# Patient Record
Sex: Male | Born: 1970 | Race: White | Hispanic: No | Marital: Single
Health system: Southern US, Community
[De-identification: ages and names within clinical notes are randomized; demographics above are authoritative.]

---

## 2009-04-02 ENCOUNTER — Ambulatory Visit: Payer: Self-pay | Admitting: Surgery

## 2009-04-11 ENCOUNTER — Ambulatory Visit: Payer: Self-pay | Admitting: Surgery

## 2011-05-27 ENCOUNTER — Ambulatory Visit: Payer: Self-pay | Admitting: Nephrology

## 2013-03-23 ENCOUNTER — Ambulatory Visit: Payer: Self-pay

## 2014-06-12 IMAGING — CR DG KNEE COMPLETE 4+V*L*
1 series · 4 of 4 positions shown · non-contrast
Comparison: none

REASON FOR EXAM: pain
COMMENTS:

PROCEDURE:     MDR - MDR KNEE LT COMPLETE W/OBLIQUES  - March 23, 2013  [DATE]
RESULT:     Comparison: None.

[Series 1: ap · 0.17mm/px · 4 of 4 slices shown]
[im 1/4]
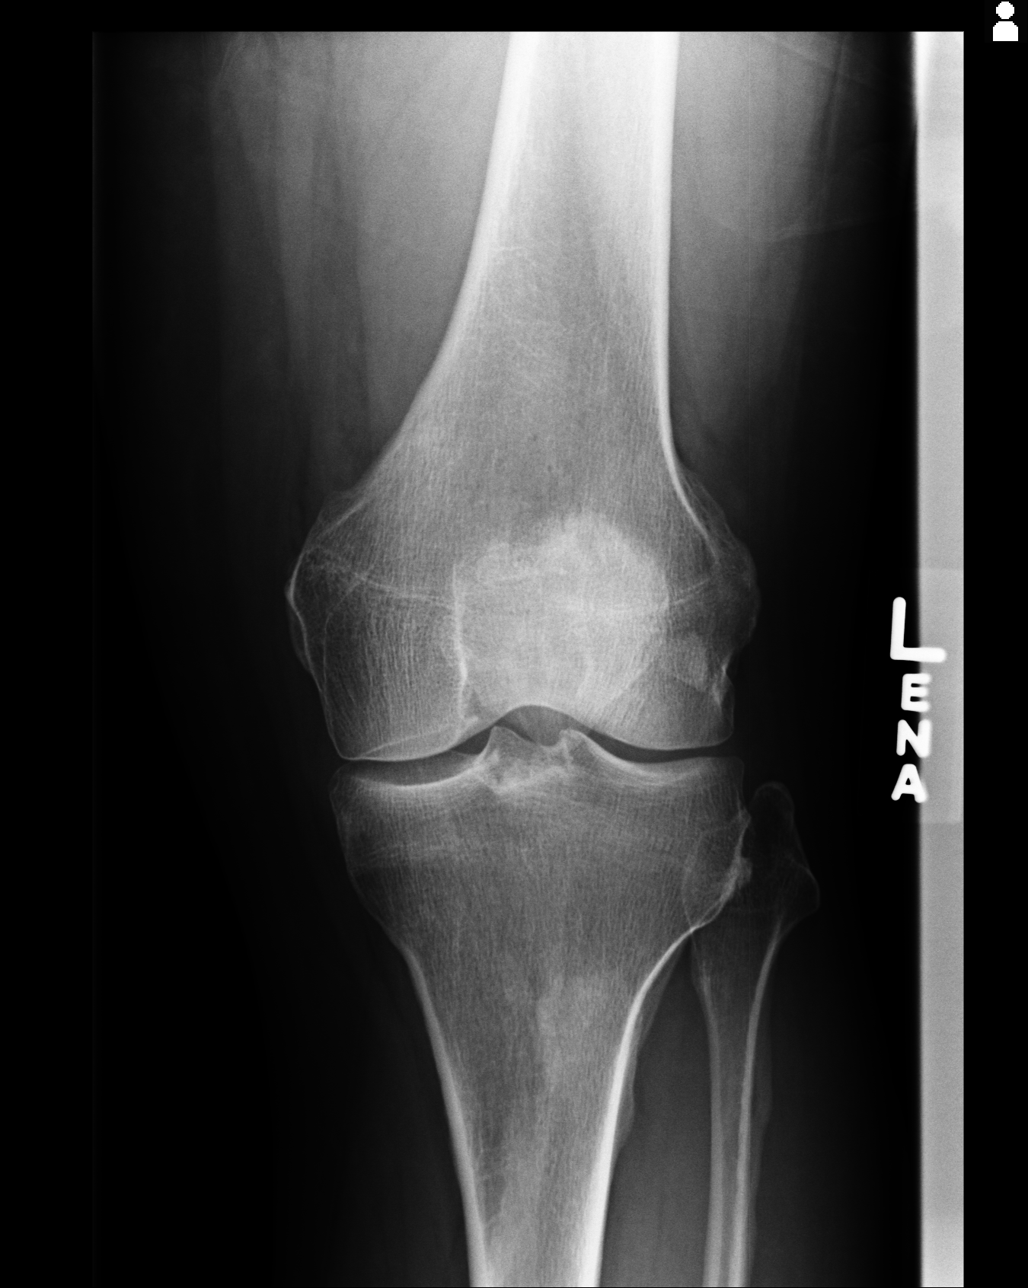
[im 2/4]
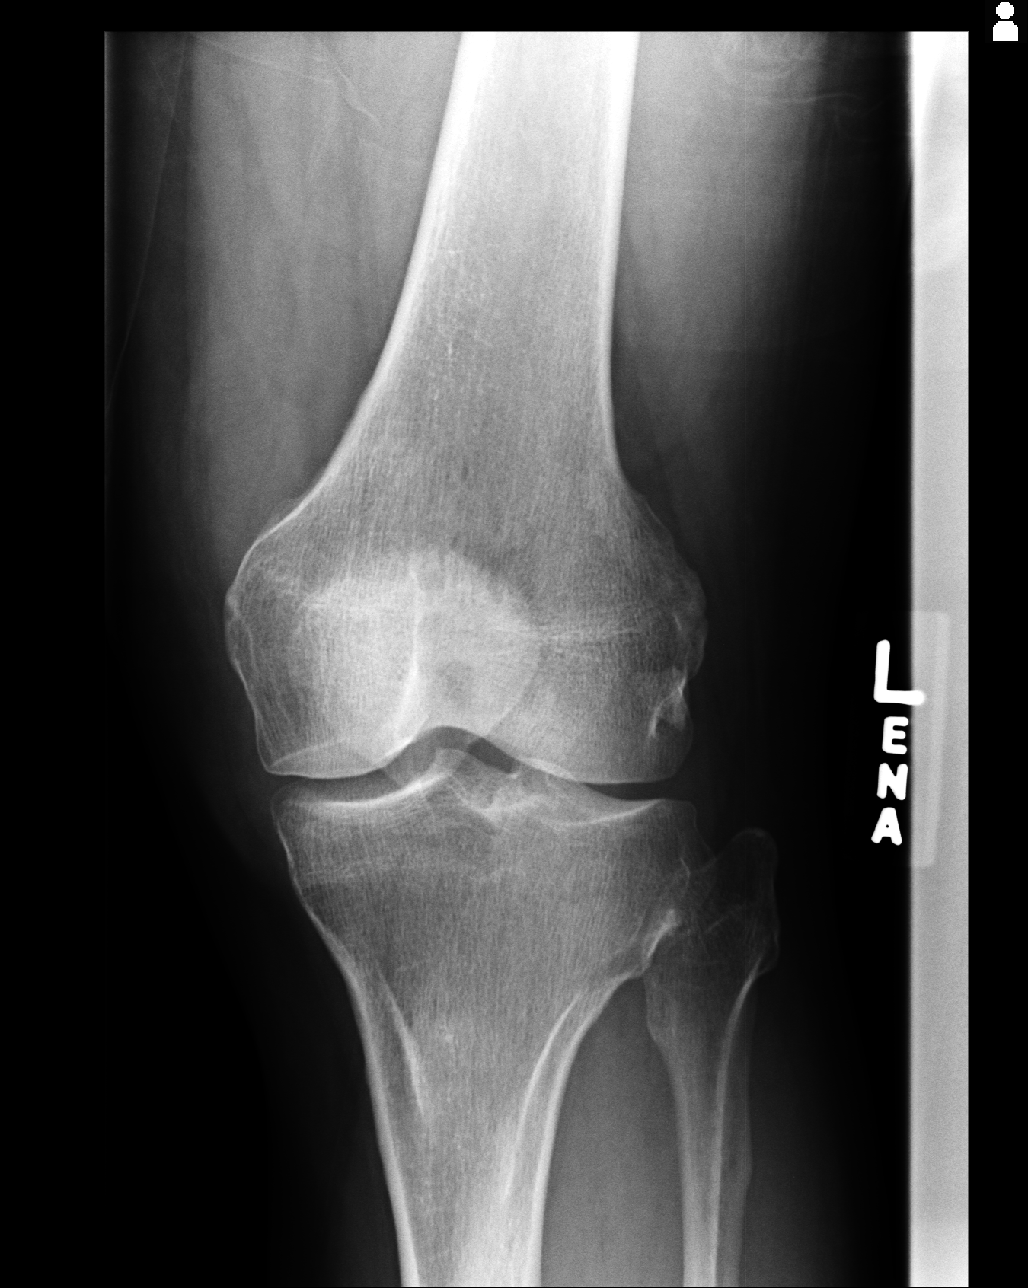
[im 3/4]
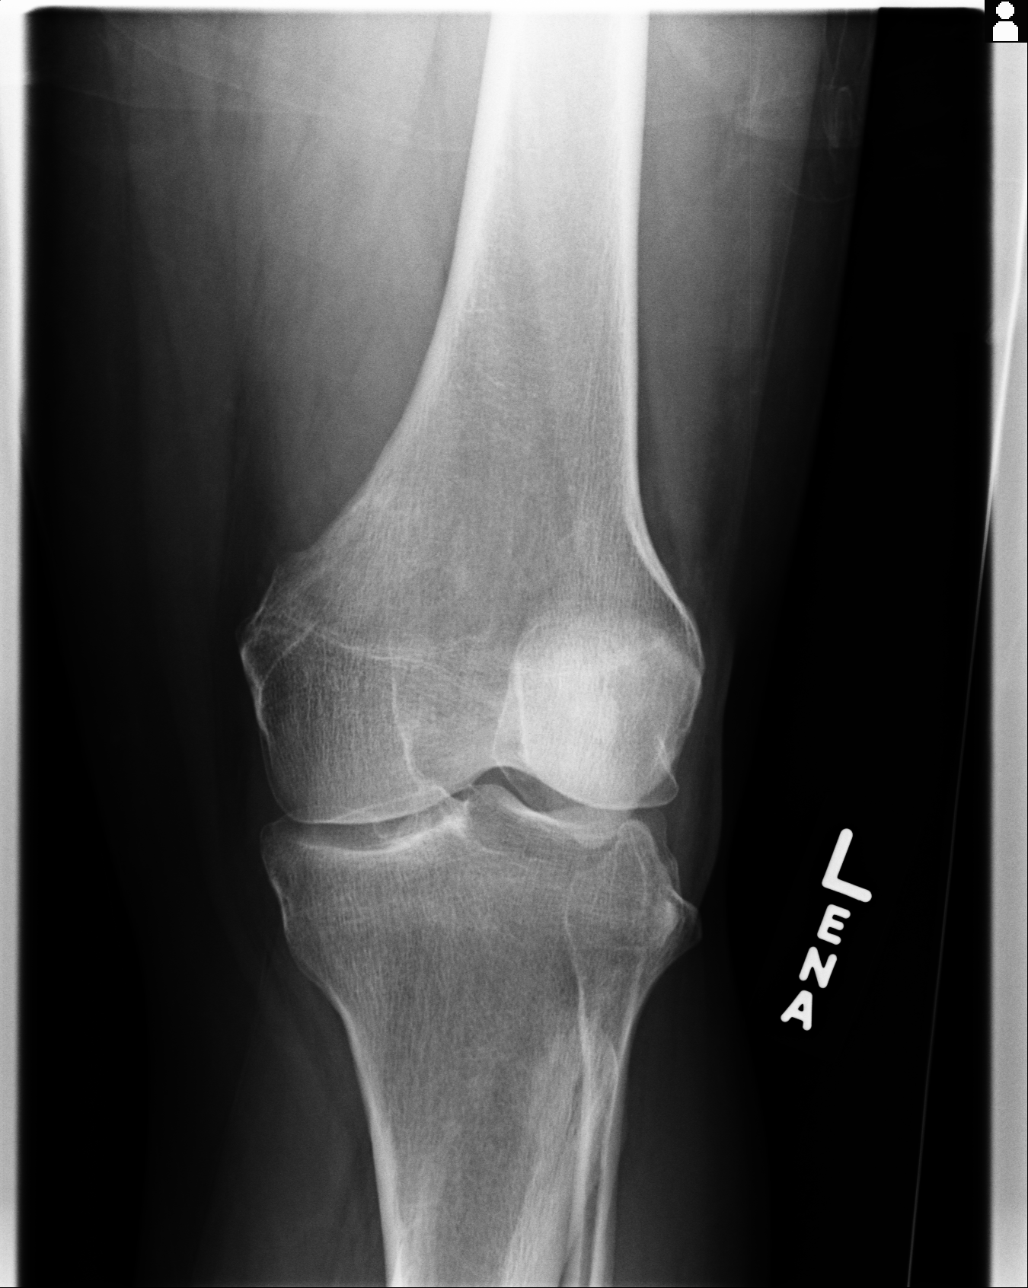
[im 4/4]
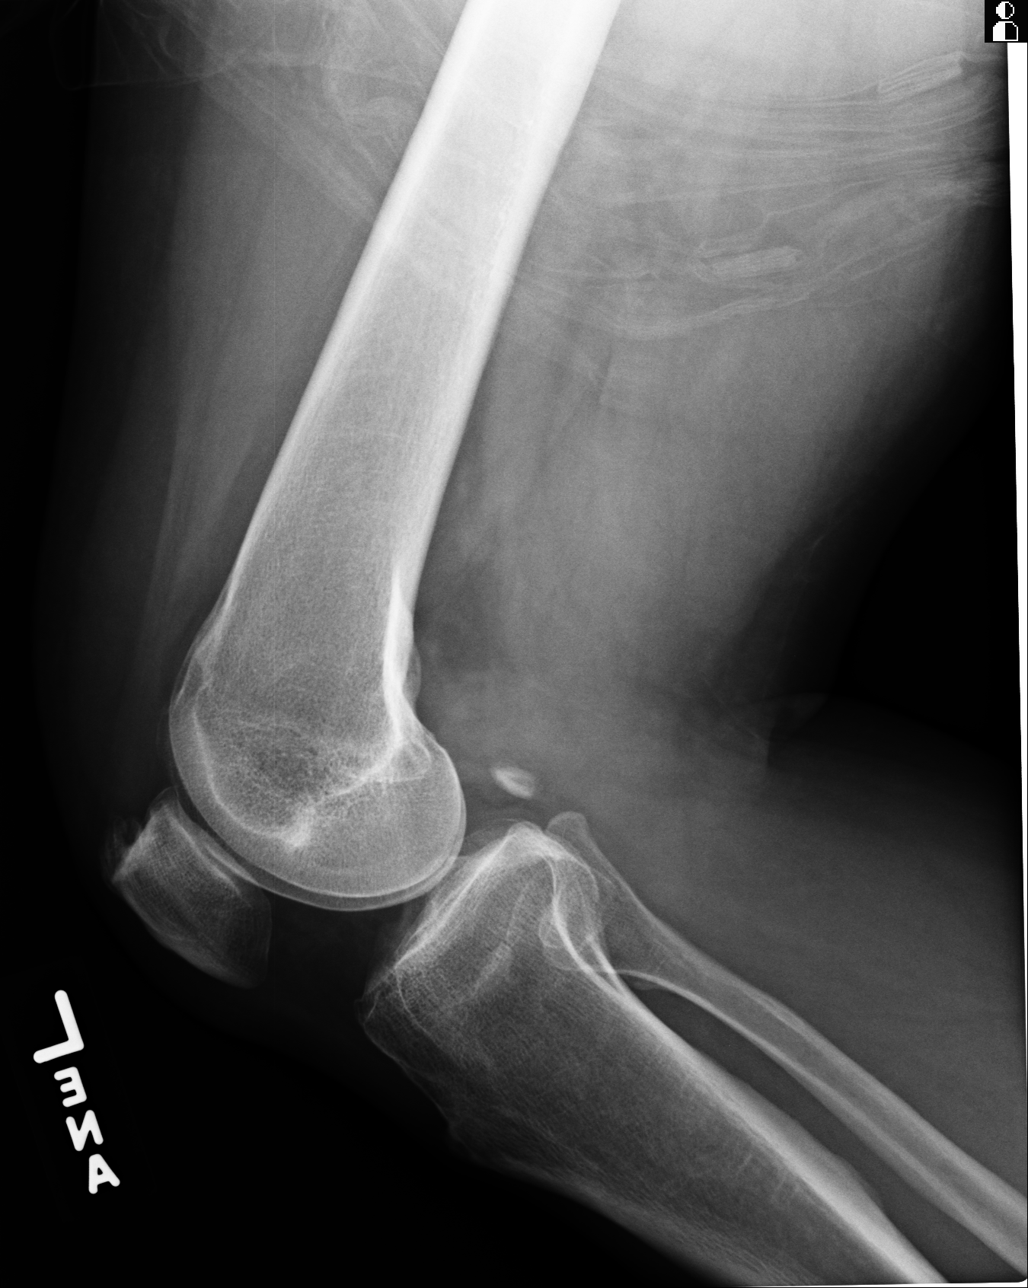

[4 of 4 positions shown; findings below may reference images not displayed]

FINDINGS: No acute fracture. No significant effusion. The joint spaces are relatively
preserved.
IMPRESSION: No acute findings.

[REDACTED]

## 2020-01-31 ENCOUNTER — Other Ambulatory Visit: Payer: Self-pay

## 2020-01-31 MED ORDER — COSENTYX SENSOREADY (300 MG) 150 MG/ML ~~LOC~~ SOAJ: 300.0000 mg | SUBCUTANEOUS | 3 refills | Status: DC

## 2020-01-31 NOTE — Telephone Encounter (Signed)
RF request e-scripted in per [facility] request.

## 2020-04-01 ENCOUNTER — Other Ambulatory Visit: Payer: Self-pay | Admitting: Dermatology

## 2020-04-09 ENCOUNTER — Ambulatory Visit (INDEPENDENT_AMBULATORY_CARE_PROVIDER_SITE_OTHER): Payer: PRIVATE HEALTH INSURANCE | Admitting: Dermatology

## 2020-04-09 ENCOUNTER — Other Ambulatory Visit: Payer: Self-pay

## 2020-04-09 DIAGNOSIS — L304 Erythema intertrigo: Secondary | ICD-10-CM

## 2020-04-09 DIAGNOSIS — L409 Psoriasis, unspecified: Secondary | ICD-10-CM | POA: Diagnosis not present

## 2020-04-09 NOTE — Progress Notes (Signed)
   Follow-Up Visit   Subjective  Bryan Chandler. is a 49 y.o. male who presents for the following: Psoriasis (3 months f/u Psoriasis, pt taking Cosentyx injection once a month with a good response, using Enstilar foam prn ).  The following portions of the chart were reviewed this encounter and updated as appropriate:  Allergies  Meds  Problems  Med Hx  Surg Hx  Fam Hx      Review of Systems:  No other skin or systemic complaints except as noted in HPI or Assessment and Plan.  Objective  Well appearing patient in no apparent distress; mood and affect are within normal limits.  A focused examination was performed including face,abdomen,back,legs,groin . Relevant physical exam findings are noted in the Assessment and Plan.  Objective  Right Abdomen (side) - Lower, Right pretibial, waistline, low back, groin, abdomen: Well-demarcated erythematous papules/plaques with silvery scale, guttate pink scaly papules. Large plaque across the lower abdomen   Objective  lower abdomen: Large erythematous silvery plaque   Assessment & Plan    Psoriasis - severe - on systemic "biologic" Consentyx with significant improvement, but not clear.(2) Right pretibial, waistline, low back, groin, abdomen; Right Abdomen (side) - Lower  BSA 6% Cont Consentyx injection once a month Cont Enstilar foam apply to skin qd-bid avoid f/g/a  Labs ordered CBC with diff, CMP, Quantiferon TB gold   Other Related Procedures CMP QuantiFERON-TB Gold Plus CBC with Differential/Platelets  Intertrigo lower abdomen  Psoriasis with intertrigo   Start Skin medicinal compound cream Intertrigo (Iodoquinole/HTC) Iodoquinole/HTC apply to skin bid   [email or web address]    Return in about 6 months (around 10/09/2020) for Psoriasis .   IAngelique Holm, CMA, am acting as scribe for Armida Sans, MD .  Documentation: I have reviewed the above documentation for accuracy and completeness, and I agree  with the above.  Armida Sans, MD

## 2020-04-09 NOTE — Patient Instructions (Signed)
Instructions for [facility] Medications  One or more of your medications was sent to the [facility] mail order compounding pharmacy. You will receive an email from them and can purchase the medicine through that link. It will then be mailed to your home at the address you confirmed. If for any reason you do not receive an email from them, please check your spam folder. If you still do not find the email, please let us know.    

## 2020-04-13 ENCOUNTER — Encounter: Payer: Self-pay | Admitting: Dermatology

## 2020-04-15 ENCOUNTER — Encounter: Payer: Self-pay | Admitting: Dermatology

## 2020-04-20 LAB — CBC WITH DIFFERENTIAL/PLATELET
Basophils Absolute: 0 10*3/uL (ref 0.0–0.2)
Basos: 1 %
EOS (ABSOLUTE): 0.2 10*3/uL (ref 0.0–0.4)
Eos: 2 %
Hematocrit: 47 % (ref 37.5–51.0)
Hemoglobin: 16.1 g/dL (ref 13.0–17.7)
Immature Grans (Abs): 0 10*3/uL (ref 0.0–0.1)
Immature Granulocytes: 0 %
Lymphocytes Absolute: 1.9 10*3/uL (ref 0.7–3.1)
Lymphs: 26 %
MCH: 29.2 pg (ref 26.6–33.0)
MCHC: 34.3 g/dL (ref 31.5–35.7)
MCV: 85 fL (ref 79–97)
Monocytes Absolute: 0.5 10*3/uL (ref 0.1–0.9)
Monocytes: 7 %
Neutrophils Absolute: 4.9 10*3/uL (ref 1.4–7.0)
Neutrophils: 64 %
Platelets: 335 10*3/uL (ref 150–450)
RBC: 5.51 x10E6/uL (ref 4.14–5.80)
RDW: 12.5 % (ref 11.6–15.4)
WBC: 7.5 10*3/uL (ref 3.4–10.8)

## 2020-04-20 LAB — QUANTIFERON-TB GOLD PLUS
QuantiFERON Mitogen Value: >10 IU/mL
QuantiFERON Nil Value: 0.04 IU/mL
QuantiFERON TB1 Ag Value: 0.03 IU/mL
QuantiFERON TB2 Ag Value: 0.04 IU/mL
QuantiFERON-TB Gold Plus: NEGATIVE

## 2020-04-20 LAB — COMPREHENSIVE METABOLIC PANEL
ALT: 28 IU/L (ref 0–44)
AST: 25 IU/L (ref 0–40)
Albumin/Globulin Ratio: 1.4 (ref 1.2–2.2)
Albumin: 4.6 g/dL (ref 4.0–5.0)
Alkaline Phosphatase: 115 IU/L (ref 48–121)
BUN/Creatinine Ratio: 12 (ref 9–20)
BUN: 8 mg/dL (ref 6–24)
Bilirubin Total: 0.6 mg/dL (ref 0.0–1.2)
CO2: 23 mmol/L (ref 20–29)
Calcium: 10 mg/dL (ref 8.7–10.2)
Chloride: 98 mmol/L (ref 96–106)
Creatinine, Ser: 0.69 mg/dL — ABNORMAL LOW (ref 0.76–1.27)
GFR calc Af Amer: 130 mL/min/{1.73_m2} (ref >59)
GFR calc non Af Amer: 112 mL/min/{1.73_m2} (ref >59)
Globulin, Total: 3.3 g/dL (ref 1.5–4.5)
Glucose: 94 mg/dL (ref 65–99)
Potassium: 4.3 mmol/L (ref 3.5–5.2)
Sodium: 135 mmol/L (ref 134–144)
Total Protein: 7.9 g/dL (ref 6.0–8.5)

## 2020-04-21 ENCOUNTER — Telehealth: Payer: Self-pay

## 2020-04-21 NOTE — Telephone Encounter (Signed)
-----   Message from David C Kowalski, MD sent at 04/21/2020 12:45 PM EDT ----- Blood counts; Chemistries all OK. TB test negative = normal  Continue Cosentyx Keep fu appts 

## 2020-04-21 NOTE — Telephone Encounter (Signed)
Left message on voicemail to return my call.  

## 2020-04-28 ENCOUNTER — Telehealth: Payer: Self-pay

## 2020-04-28 NOTE — Telephone Encounter (Signed)
Left message on voicemail to return my call.  

## 2020-04-28 NOTE — Telephone Encounter (Signed)
-----   Message from Deirdre Evener, MD sent at 04/21/2020 12:45 PM EDT ----- Blood counts; Chemistries all OK. TB test negative = normal  Continue Cosentyx Keep fu appts

## 2020-04-28 NOTE — Telephone Encounter (Signed)
Patient advised labs OK and to continue Cosentyx.

## 2020-06-02 ENCOUNTER — Other Ambulatory Visit: Payer: Self-pay

## 2020-06-02 MED ORDER — COSENTYX SENSOREADY (300 MG) 150 MG/ML ~~LOC~~ SOAJ: 300.0000 mg | SUBCUTANEOUS | 5 refills | Status: DC

## 2020-06-02 NOTE — Progress Notes (Signed)
RF request approved.

## 2020-06-04 ENCOUNTER — Other Ambulatory Visit: Payer: Self-pay

## 2020-06-04 MED ORDER — COSENTYX SENSOREADY (300 MG) 150 MG/ML ~~LOC~~ SOAJ: 300.0000 mg | SUBCUTANEOUS | 5 refills | Status: DC

## 2020-06-04 NOTE — Progress Notes (Signed)
Cosentyx sent to [facility] to correct the quantity.

## 2020-06-18 ENCOUNTER — Ambulatory Visit (INDEPENDENT_AMBULATORY_CARE_PROVIDER_SITE_OTHER): Payer: PRIVATE HEALTH INSURANCE | Admitting: Dermatology

## 2020-06-18 ENCOUNTER — Other Ambulatory Visit: Payer: Self-pay

## 2020-06-18 DIAGNOSIS — L409 Psoriasis, unspecified: Secondary | ICD-10-CM

## 2020-06-18 MED ORDER — MOMETASONE FUROATE 0.1 % EX CREA: 1.0000 "application " | TOPICAL_CREAM | Freq: Every day | CUTANEOUS | 3 refills | Status: DC | PRN

## 2020-06-18 NOTE — Progress Notes (Signed)
   Follow-Up Visit   Subjective  Bryan Chandler. is a 49 y.o. male who presents for the following: Psoriasis (2 months f/u Psoriasis flare, pt taking Consenytx injection once a month). Pt is not using any topical creams.  The following portions of the chart were reviewed this encounter and updated as appropriate:  Allergies  Meds  Problems  Med Hx  Surg Hx  Fam Hx     Review of Systems:  No other skin or systemic complaints except as noted in HPI or Assessment and Plan.  Objective  Well appearing patient in no apparent distress; mood and affect are within normal limits.  A focused examination was performed including R ankle, waistline . Relevant physical exam findings are noted in the Assessment and Plan.  Objective  R ankle, waistline: 8.5 cm plaque at R ankle, 2 plaques at waistline    Assessment & Plan  Psoriasis with skin flare R ankle, waistline Psoriasis - severe - on systemic "biologic" Consentyx with significant improvement, but not clear.  Discussed with pt he will get occasionally flares, no cure for Psoriasis can only control.   Start Mometasone cream apply to skin qd-bid prn  Cont Cosentyx injection once every month   Ordered Medications: mometasone (ELOCON) 0.1 % cream  Return for as scheduled in December .  IAngelique Holm, CMA, am acting as scribe for Bryan Sans, MD .  Documentation: I have reviewed the above documentation for accuracy and completeness, and I agree with the above.  Bryan Sans, MD

## 2020-06-25 ENCOUNTER — Encounter: Payer: Self-pay | Admitting: Dermatology

## 2020-10-08 ENCOUNTER — Ambulatory Visit (INDEPENDENT_AMBULATORY_CARE_PROVIDER_SITE_OTHER): Payer: PRIVATE HEALTH INSURANCE | Admitting: Dermatology

## 2020-10-08 ENCOUNTER — Other Ambulatory Visit: Payer: Self-pay

## 2020-10-08 DIAGNOSIS — L409 Psoriasis, unspecified: Secondary | ICD-10-CM | POA: Diagnosis not present

## 2020-10-08 NOTE — Progress Notes (Signed)
   Follow-Up Visit   Subjective  Bryan Chandler. is a 49 y.o. male who presents for the following: Psoriasis (6 months f/u Psoriasis treating with Cosentyx injection every 4 weeks injections by his mom and using Betamethasone cream on the feet, arms ).  The following portions of the chart were reviewed this encounter and updated as appropriate:   Allergies  Meds  Problems  Med Hx  Surg Hx  Fam Hx     Review of Systems:  No other skin or systemic complaints except as noted in HPI or Assessment and Plan.  Objective  Well appearing patient in no apparent distress; mood and affect are within normal limits.  A focused examination was performed including face,scalp,arms.legs,ankles,chest,back . Relevant physical exam findings are noted in the Assessment and Plan.  Objective  pretibial, ankle, back, L elbow, L arm, scalp:  Well-demarcated erythematous papules/plaques with silvery scale, guttate pink scaly papules.    Assessment & Plan  Psoriasis pretibial, ankle, back, L elbow, L arm, scalp  Psoriasis much improved but not clear.  Better controlled, but not to goal.  Psoriasis - severe on systemic "biologic" treatment injections.  Psoriasis is a chronic non-curable, but treatable genetic/hereditary disease that may have other systemic features affecting other organ systems such as joints (Psoriatic Arthritis).  It is linked with heart disease, inflammatory bowel disease, non-alcoholic fatty liver disease, and depression. Significant skin psoriasis and/or psoriatic arthritis may have significant symptoms and affects activities of daily activity and often benefits from systemic "biologic" injection treatments.  These "biologic" treatments have some potential side effects including immunosuppression and require pre-treatment laboratory screening and periodic laboratory monitoring and periodic in person evaluation and monitoring by the attending dermatologist physician.   BSA on Cosentyx  5%   Cont Betamethasone cream qd-bid Cont Cosenyx injection every 4 weeks   We will order labs at the next office visit   Other Related Medications mometasone (ELOCON) 0.1 % cream  Return in about 6 months (around 04/08/2021).  IAngelique Holm, CMA, am acting as scribe for Armida Sans, MD .  Documentation: I have reviewed the above documentation for accuracy and completeness, and I agree with the above.  Armida Sans, MD

## 2020-10-13 ENCOUNTER — Encounter: Payer: Self-pay | Admitting: Dermatology

## 2020-11-11 ENCOUNTER — Other Ambulatory Visit: Payer: Self-pay

## 2020-11-11 DIAGNOSIS — L409 Psoriasis, unspecified: Secondary | ICD-10-CM

## 2020-11-11 MED ORDER — COSENTYX SENSOREADY (300 MG) 150 MG/ML ~~LOC~~ SOAJ: 300.0000 mg | SUBCUTANEOUS | 5 refills | Status: DC

## 2021-03-10 ENCOUNTER — Other Ambulatory Visit: Payer: Self-pay | Admitting: Dermatology

## 2021-04-01 ENCOUNTER — Ambulatory Visit: Payer: 59 | Admitting: Dermatology

## 2021-04-01 ENCOUNTER — Other Ambulatory Visit: Payer: Self-pay

## 2021-04-01 DIAGNOSIS — L409 Psoriasis, unspecified: Secondary | ICD-10-CM

## 2021-04-01 NOTE — Patient Instructions (Signed)

## 2021-04-01 NOTE — Progress Notes (Signed)
   Follow-Up Visit   Subjective  Bryan Chandler. is a 50 y.o. male who presents for the following: Psoriasis (Back, Cosentxy sq injections q 4wks, Calcipotriene and Betamethasone mixing the 2 and using qd aa).  The following portions of the chart were reviewed this encounter and updated as appropriate:   Allergies  Meds  Problems  Med Hx  Surg Hx  Fam Hx      Review of Systems:  No other skin or systemic complaints except as noted in HPI or Assessment and Plan.  Objective  Well appearing patient in no apparent distress; mood and affect are within normal limits.  A focused examination was performed including face, scalp, back, arms. Relevant physical exam findings are noted in the Assessment and Plan.  Scalp, back Few guttate plaques arms, low back, R ankle   Assessment & Plan    Long term medication management.  Psoriasis -severe and generalized currently much better controlled and almost clear on systemic biologic injections Cosentyx Scalp, back  Psoriasis - severe on systemic "biologic" treatment injections.  Psoriasis is a chronic non-curable, but treatable genetic/hereditary disease that may have other systemic features affecting other organ systems such as joints (Psoriatic Arthritis).  It is linked with heart disease, inflammatory bowel disease, non-alcoholic fatty liver disease, and depression. Significant skin psoriasis and/or psoriatic arthritis may have significant symptoms and affects activities of daily activity and often benefits from systemic "biologic" injection treatments.  These "biologic" treatments have some potential side effects including immunosuppression and require pre-treatment laboratory screening and periodic laboratory monitoring and periodic in person evaluation and monitoring by the attending dermatologist physician.   Improved Labs from 03/12/21 WNL  Will order Quantiferon-TB gold plus Will request PCP if she does yearly labs to include  quantiferon-TB gold plus with next years lab work  Cont Cosentyx sq injections q 4 wks Cont Betamethasone lotion qd (mixing with calcipotriene solution) Cont Calcipotriene solution qd (mixing with Betamethasone lotion)  Related Procedures QuantiFERON-TB Gold Plus  Related Medications mometasone (ELOCON) 0.1 % cream Apply 1 application topically daily as needed (Rash).  Secukinumab, 300 MG Dose, (COSENTYX SENSOREADY, 300 MG,) 150 MG/ML SOAJ Inject 300 mg into the skin every 28 (twenty-eight) days. For maintenance.  Return in about 6 months (around 10/01/2021) for Psoriasis f/u.   I, Ardis Rowan, RMA, am acting as scribe for Armida Sans, MD . Documentation: I have reviewed the above documentation for accuracy and completeness, and I agree with the above.  Armida Sans, MD

## 2021-04-03 ENCOUNTER — Encounter: Payer: Self-pay | Admitting: Dermatology

## 2021-04-26 ENCOUNTER — Other Ambulatory Visit: Payer: Self-pay | Admitting: Dermatology

## 2021-04-26 DIAGNOSIS — L409 Psoriasis, unspecified: Secondary | ICD-10-CM

## 2021-09-22 LAB — QUANTIFERON-TB GOLD PLUS
QuantiFERON Mitogen Value: >10 IU/mL
QuantiFERON Nil Value: 0.03 IU/mL
QuantiFERON TB1 Ag Value: 0.03 IU/mL
QuantiFERON TB2 Ag Value: 0.03 IU/mL
QuantiFERON-TB Gold Plus: NEGATIVE

## 2021-09-25 ENCOUNTER — Telehealth: Payer: Self-pay

## 2021-09-25 DIAGNOSIS — L409 Psoriasis, unspecified: Secondary | ICD-10-CM

## 2021-09-25 MED ORDER — COSENTYX SENSOREADY (300 MG) 150 MG/ML ~~LOC~~ SOAJ: SUBCUTANEOUS | 5 refills | Status: DC

## 2021-09-25 NOTE — Telephone Encounter (Signed)
Left message on voicemail to return my call.  

## 2021-09-25 NOTE — Telephone Encounter (Signed)
-----   Message from Bryan Evener, MD sent at 09/22/2021  5:21 PM EST ----- Lab from 09/18/2021 is OK/ normal Quantiferon Gold/ TB test = Negative = normal Keep follow up appt 10/05/2021 Continue Cosentyx for Psoriasis

## 2021-09-29 NOTE — Telephone Encounter (Signed)
Informed pt of results and to keep f/u. Pt will be switching to a new insurance at the beginning of the new year. I advised to get the new info to [place] as soon as they can so that we can get working on making sure that he remains covered with his cosentyx.

## 2021-10-05 ENCOUNTER — Other Ambulatory Visit: Payer: Self-pay

## 2021-10-05 ENCOUNTER — Ambulatory Visit (INDEPENDENT_AMBULATORY_CARE_PROVIDER_SITE_OTHER): Payer: 59 | Admitting: Dermatology

## 2021-10-05 DIAGNOSIS — L409 Psoriasis, unspecified: Secondary | ICD-10-CM | POA: Diagnosis not present

## 2021-10-05 MED ORDER — VTAMA 1 % EX CREA: 1.0000 "application " | TOPICAL_CREAM | Freq: Every day | CUTANEOUS | 3 refills | Status: DC

## 2021-10-05 NOTE — Progress Notes (Signed)
   Follow-Up Visit   Subjective  Bryan Chandler. is a 50 y.o. male who presents for the following: Psoriasis (6 month follow up - Cosentyx injections q 4 weeks - patch on back but it's not scaly or itchy).  The following portions of the chart were reviewed this encounter and updated as appropriate:   Allergies  Meds  Problems  Med Hx  Surg Hx  Fam Hx     Review of Systems:  No other skin or systemic complaints except as noted in HPI or Assessment and Plan.  Objective  Well appearing patient in no apparent distress; mood and affect are within normal limits.  A focused examination was performed including scalp, trunk,extremities. Relevant physical exam findings are noted in the Assessment and Plan.  Psoriatic patches of lower back, sides and left elbow             Assessment & Plan  Psoriasis -recently flared while on Cosentyx biologic injection This may be from cooler weather.  Psoriasis is a chronic non-curable, but treatable genetic/hereditary disease that may have other systemic features affecting other organ systems such as joints (Psoriatic Arthritis). It is associated with an increased risk of inflammatory bowel disease, heart disease, non-alcoholic fatty liver disease, and depression.    Continue Cosentyx injections every 4 weeks. Start Vtama cream qd - samples given.  Tapinarof (VTAMA) 1 % CREA Apply 1 application topically daily. Related Medications mometasone (ELOCON) 0.1 % cream Apply 1 application topically daily as needed (Rash). Secukinumab, 300 MG Dose, (COSENTYX SENSOREADY, 300 MG,) 150 MG/ML SOAJ INJECT 300MG  SUBCUTANEOUSLY EVERY 28 DAYS FOR MAINTENANCE.  Return in about 2 months (around 12/06/2021) for Psoriasis.  I, 02/03/2022, CMA, am acting as scribe for Joanie Coddington, MD . Documentation: I have reviewed the above documentation for accuracy and completeness, and I agree with the above.  Armida Sans, MD

## 2021-10-05 NOTE — Patient Instructions (Signed)

## 2021-10-10 ENCOUNTER — Encounter: Payer: Self-pay | Admitting: Dermatology

## 2021-11-17 ENCOUNTER — Other Ambulatory Visit: Payer: Self-pay

## 2021-11-17 DIAGNOSIS — L409 Psoriasis, unspecified: Secondary | ICD-10-CM

## 2021-11-17 MED ORDER — COSENTYX SENSOREADY (300 MG) 150 MG/ML ~~LOC~~ SOAJ: SUBCUTANEOUS | 4 refills | Status: DC

## 2021-11-17 NOTE — Progress Notes (Signed)
Pt requested new pharmacy  

## 2021-12-07 ENCOUNTER — Other Ambulatory Visit: Payer: Self-pay

## 2021-12-07 ENCOUNTER — Encounter: Payer: Self-pay | Admitting: Dermatology

## 2021-12-07 ENCOUNTER — Ambulatory Visit (INDEPENDENT_AMBULATORY_CARE_PROVIDER_SITE_OTHER): Payer: 59 | Admitting: Dermatology

## 2021-12-07 DIAGNOSIS — Z79899 Other long term (current) drug therapy: Secondary | ICD-10-CM

## 2021-12-07 DIAGNOSIS — L409 Psoriasis, unspecified: Secondary | ICD-10-CM

## 2021-12-07 NOTE — Patient Instructions (Addendum)
Reviewed risks of biologics including immunosuppression, infections, injection site reaction, and failure to improve condition. Goal is control of skin condition, not cure.  Some older biologics such as Humira and Enbrel may slightly increase risk of malignancy and may worsen congestive heart failure. The use of biologics requires long term medication management, including periodic office visits and monitoring of blood work. ° °Topical steroids (such as triamcinolone, fluocinolone, fluocinonide, mometasone, clobetasol, halobetasol, betamethasone, hydrocortisone) can cause thinning and lightening of the skin if they are used for too long in the same area. Your physician has selected the right strength medicine for your problem and area affected on the body. Please use your medication only as directed by your physician to prevent side effects.  ° ° °If You Need Anything After Your Visit ° °If you have any questions or concerns for your doctor, please call our main line at [phone] and press option 4 to reach your doctor's medical assistant. If no one answers, please leave a voicemail as directed and we will return your call as soon as possible. Messages left after 4 pm will be answered the following business day.  ° °You may also send us a message via MyChart. We typically respond to MyChart messages within 1-2 business days. ° °For prescription refills, please ask your pharmacy to contact our office. Our fax number is [phone]. ° °If you have an urgent issue when the clinic is closed that cannot wait until the next business day, you can page your doctor at the number below.   ° °Please note that while we do our best to be available for urgent issues outside of office hours, we are not available 24/7.  ° °If you have an urgent issue and are unable to reach us, you may choose to seek medical care at your doctor's office, retail clinic, urgent care center, or emergency room. ° °If you have a medical emergency,  please immediately call 911 or go to the emergency department. ° °Pager Numbers ° °- Dr. Kowalski: [phone] ° °- Dr. Moye: [phone] ° °- Dr. Stewart: [phone] ° °In the event of inclement weather, please call our main line at [phone] for an update on the status of any delays or closures. ° °Dermatology Medication Tips: °Please keep the boxes that topical medications come in in order to help keep track of the instructions about where and how to use these. Pharmacies typically print the medication instructions only on the boxes and not directly on the medication tubes.  ° °If your medication is too expensive, please contact our office at [phone] option 4 or send us a message through MyChart.  ° °We are unable to tell what your co-pay for medications will be in advance as this is different depending on your insurance coverage. However, we may be able to find a substitute medication at lower cost or fill out paperwork to get insurance to cover a needed medication.  ° °If a prior authorization is required to get your medication covered by your insurance company, please allow us 1-2 business days to complete this process. ° °Drug prices often vary depending on where the prescription is filled and some pharmacies may offer cheaper prices. ° °The website www.goodrx.com contains coupons for medications through different pharmacies. The prices here do not account for what the cost may be with help from insurance (it may be cheaper with your insurance), but the website can give you the price if you did not use any insurance.  °- You can   print the associated coupon and take it with your prescription to the pharmacy.  °- You may also stop by our office during regular business hours and pick up a GoodRx coupon card.  °- If you need your prescription sent electronically to a different pharmacy, notify our office through [facility] MyChart or by phone at [phone] option 4. ° ° ° ° °Si Usted Necesita Algo  Después de Su Visita ° °También puede enviarnos un mensaje a través de MyChart. Por lo general respondemos a los mensajes de MyChart en el transcurso de 1 a 2 días hábiles. ° °Para renovar recetas, por favor pida a su farmacia que se ponga en contacto con nuestra oficina. Nuestro número de fax es el [phone]. ° °Si tiene un asunto urgente cuando la clínica esté cerrada y que no puede esperar hasta el siguiente día hábil, puede llamar/localizar a su doctor(a) al número que aparece a continuación.  ° °Por favor, tenga en cuenta que aunque hacemos todo lo posible para estar disponibles para asuntos urgentes fuera del horario de oficina, no estamos disponibles las 24 horas del día, los 7 días de la semana.  ° °Si tiene un problema urgente y no puede comunicarse con nosotros, puede optar por buscar atención médica  en el consultorio de su doctor(a), en una clínica privada, en un centro de atención urgente o en una sala de emergencias. ° °Si tiene una emergencia médica, por favor llame inmediatamente al 911 o vaya a la sala de emergencias. ° °Números de bíper ° °- Dr. Kowalski: [phone] ° °- Dra. Moye: [phone] ° °- Dra. Stewart: [phone] ° °En caso de inclemencias del tiempo, por favor llame a nuestra línea principal al [phone] para una actualización sobre el estado de cualquier retraso o cierre. ° °Consejos para la medicación en dermatología: °Por favor, guarde las cajas en las que vienen los medicamentos de uso tópico para ayudarle a seguir las instrucciones sobre dónde y cómo usarlos. Las farmacias generalmente imprimen las instrucciones del medicamento sólo en las cajas y no directamente en los tubos del medicamento.  ° °Si su medicamento es muy caro, por favor, póngase en contacto con nuestra oficina llamando al [phone] y presione la opción 4 o envíenos un mensaje a través de MyChart.  ° °No podemos decirle cuál será su copago por los medicamentos por adelantado ya que esto es diferente  dependiendo de la cobertura de su seguro. Sin embargo, es posible que podamos encontrar un medicamento sustituto a menor costo o llenar un formulario para que el seguro cubra el medicamento que se considera necesario.  ° °Si se requiere una autorización previa para que su compañía de seguros cubra su medicamento, por favor permítanos de 1 a 2 días hábiles para completar este proceso. ° °Los precios de los medicamentos varían con frecuencia dependiendo del lugar de dónde se surte la receta y alguna farmacias pueden ofrecer precios más baratos. ° °El sitio web www.goodrx.com tiene cupones para medicamentos de diferentes farmacias. Los precios aquí no tienen en cuenta lo que podría costar con la ayuda del seguro (puede ser más barato con su seguro), pero el sitio web puede darle el precio si no utilizó ningún seguro.  °- Puede imprimir el cupón correspondiente y llevarlo con su receta a la farmacia.  °- También puede pasar por nuestra oficina durante el horario de atención regular y recoger una tarjeta de cupones de GoodRx.  °- Si necesita que su receta se envíe electrónicamente a una farmacia diferente, informe a nuestra   oficina a través de MyChart de [facility] o por teléfono llamando al [phone] y presione la opción 4. ° °

## 2021-12-07 NOTE — Progress Notes (Signed)
° °  Follow-Up Visit   Subjective  Bryan Chandler. is a 51 y.o. male who presents for the following: Psoriasis (2 month recheck. Psoriasis flare in December. Was given samples of Vtama, has not called to get Rx yet. Is on Cosentyx as directed. States flare is still here).  The following portions of the chart were reviewed this encounter and updated as appropriate:  Allergies   Meds   Problems   Med Hx   Surg Hx   Fam Hx      Review of Systems: No other skin or systemic complaints except as noted in HPI or Assessment and Plan.  Objective  Well appearing patient in no apparent distress; mood and affect are within normal limits.  A focused examination was performed including head, arms, torso. Relevant physical exam findings are noted in the Assessment and Plan.  Scalp, back, arms, legs Well-demarcated erythematous papules/plaques with silvery scale, guttate pink scaly papules. Trunk extremities scalp   Assessment & Plan  Psoriasis Scalp, back, arms, legs Chronic and persistent condition with duration or expected duration over one year. Condition is bothersome/symptomatic for patient. Currently flared.  Improved from Cosentyx start, but worse from last visit.   Psoriasis - severe on systemic biologic treatment injections.  Psoriasis is a chronic non-curable, but treatable genetic/hereditary disease that may have other systemic features affecting other organ systems such as joints (Psoriatic Arthritis).  It is linked with heart disease, inflammatory bowel disease, non-alcoholic fatty liver disease, and depression. Significant skin psoriasis and/or psoriatic arthritis may have significant symptoms and affects activities of daily activity and often benefits from systemic biologic injection treatments.  These biologic treatments have some potential side effects including immunosuppression and require pre-treatment laboratory screening and periodic laboratory monitoring and periodic in  person evaluation and monitoring by the attending dermatologist physician (long term medication management).  Continue Cosentyx injections every 4 weeks. Start Vtama cream qd - samples given.  If not improving consider adding oral Otezla.  Will order labs at next visit.   Related Medications mometasone (ELOCON) 0.1 % cream Apply 1 application topically daily as needed (Rash).  Tapinarof (VTAMA) 1 % CREA Apply 1 application topically daily. Secukinumab, 300 MG Dose, (COSENTYX SENSOREADY, 300 MG,) 150 MG/ML SOAJ INJECT 300MG  SUBCUTANEOUSLY EVERY 28 DAYS FOR MAINTENANCE.  Return for Psoriasis Follow Up in 4-5 months.  I, , CMA, am acting as scribe for Lawson Radar, MD. Documentation: I have reviewed the above documentation for accuracy and completeness, and I agree with the above.  Armida Sans, MD

## 2021-12-11 ENCOUNTER — Encounter: Payer: Self-pay | Admitting: Dermatology

## 2022-01-28 ENCOUNTER — Other Ambulatory Visit: Payer: Self-pay

## 2022-01-28 DIAGNOSIS — L409 Psoriasis, unspecified: Secondary | ICD-10-CM

## 2022-01-28 MED ORDER — MOMETASONE FUROATE 0.1 % EX CREA: 1.0000 "application " | TOPICAL_CREAM | Freq: Every day | CUTANEOUS | 0 refills | Status: DC | PRN

## 2022-01-28 NOTE — Progress Notes (Signed)
Patient's mother called asking for RF of Mometasone needed for rash. RX sent in. aw ?

## 2022-03-17 DIAGNOSIS — M47819 Spondylosis without myelopathy or radiculopathy, site unspecified: Secondary | ICD-10-CM | POA: Diagnosis not present

## 2022-03-17 DIAGNOSIS — E669 Obesity, unspecified: Secondary | ICD-10-CM | POA: Diagnosis not present

## 2022-03-17 DIAGNOSIS — E782 Mixed hyperlipidemia: Secondary | ICD-10-CM | POA: Diagnosis not present

## 2022-03-17 DIAGNOSIS — R7302 Impaired glucose tolerance (oral): Secondary | ICD-10-CM | POA: Diagnosis not present

## 2022-03-17 DIAGNOSIS — I10 Essential (primary) hypertension: Secondary | ICD-10-CM | POA: Diagnosis not present

## 2022-03-17 DIAGNOSIS — Z79899 Other long term (current) drug therapy: Secondary | ICD-10-CM | POA: Diagnosis not present

## 2022-03-17 DIAGNOSIS — M47812 Spondylosis without myelopathy or radiculopathy, cervical region: Secondary | ICD-10-CM | POA: Diagnosis not present

## 2022-03-17 DIAGNOSIS — H6123 Impacted cerumen, bilateral: Secondary | ICD-10-CM | POA: Diagnosis not present

## 2022-03-17 DIAGNOSIS — Z872 Personal history of diseases of the skin and subcutaneous tissue: Secondary | ICD-10-CM | POA: Diagnosis not present

## 2022-03-17 DIAGNOSIS — R972 Elevated prostate specific antigen [PSA]: Secondary | ICD-10-CM | POA: Diagnosis not present

## 2022-03-17 DIAGNOSIS — Z0000 Encounter for general adult medical examination without abnormal findings: Secondary | ICD-10-CM | POA: Diagnosis not present

## 2022-03-24 ENCOUNTER — Other Ambulatory Visit: Payer: Self-pay | Admitting: Dermatology

## 2022-03-24 DIAGNOSIS — L409 Psoriasis, unspecified: Secondary | ICD-10-CM

## 2022-04-15 ENCOUNTER — Ambulatory Visit (INDEPENDENT_AMBULATORY_CARE_PROVIDER_SITE_OTHER): Payer: 59 | Admitting: Dermatology

## 2022-04-15 DIAGNOSIS — L409 Psoriasis, unspecified: Secondary | ICD-10-CM | POA: Diagnosis not present

## 2022-04-15 DIAGNOSIS — Z79899 Other long term (current) drug therapy: Secondary | ICD-10-CM

## 2022-04-15 MED ORDER — COSENTYX SENSOREADY (300 MG) 150 MG/ML ~~LOC~~ SOAJ: SUBCUTANEOUS | 4 refills | Status: DC

## 2022-04-15 NOTE — Progress Notes (Unsigned)
   Follow-Up Visit   Subjective  Winner Valeriano. is a 51 y.o. male who presents for the following: Psoriasis (Scalp, back, arms, legs, 32m f/u, Cosentyx injections q 4 wks, Mometasone cr qd).    The following portions of the chart were reviewed this encounter and updated as appropriate:       Review of Systems:  No other skin or systemic complaints except as noted in HPI or Assessment and Plan.  Objective  Well appearing patient in no apparent distress; mood and affect are within normal limits.  A focused examination was performed including scalp, back, arms, legs. Relevant physical exam findings are noted in the Assessment and Plan.  scalp, arms, legs, back Plaques arms, elbows, legs    Assessment & Plan  Psoriasis scalp, arms, legs, back  Chronic and persistent condition with duration or expected duration over one year. Condition is symptomatic / bothersome to patient. Not to goal.   Psoriasis - severe on systemic "biologic" treatment injections.  Psoriasis is a chronic non-curable, but treatable genetic/hereditary disease that may have other systemic features affecting other organ systems such as joints (Psoriatic Arthritis).  It is linked with heart disease, inflammatory bowel disease, non-alcoholic fatty liver disease, and depression. Significant skin psoriasis and/or psoriatic arthritis may have significant symptoms and affects activities of daily activity and often benefits from systemic "biologic" injection treatments.  These "biologic" treatments have some potential side effects including immunosuppression and require pre-treatment laboratory screening and periodic laboratory monitoring and periodic in person evaluation and monitoring by the attending dermatologist physician (long term medication management).   Labs from 03/17/22 viewed, will plan QuantiFERON TB gold test on f/u  Discussed switching to [facility], pt would like to think about it, information given  Cont  Cosentyx sq injections q 4 wks Decrease Mometasone to qd up to 5d/wk aa psoriasis prn flares  Topical steroids (such as triamcinolone, fluocinolone, fluocinonide, mometasone, clobetasol, halobetasol, betamethasone, hydrocortisone) can cause thinning and lightening of the skin if they are used for too long in the same area. Your physician has selected the right strength medicine for your problem and area affected on the body. Please use your medication only as directed by your physician to prevent side effects.    Related Medications mometasone (ELOCON) 0.1 % cream Apply 1 application. topically daily as needed (Rash).  Secukinumab, 300 MG Dose, (COSENTYX SENSOREADY, 300 MG,) 150 MG/ML SOAJ INJECT TWO PENS SUBCUTANEOUSLY EVERY 4 WEEKS. REFRIGERATE. ALLOW 15 TO 30 MINUTES AT ROOM TEMP PRIOR TO ADMINISTRATION.   Return in about 6 months (around 10/15/2022) for Psoriasis f/u, plan TB test.  I, Ardis Rowan, RMA, am acting as scribe for Armida Sans, MD .

## 2022-04-15 NOTE — Patient Instructions (Signed)
Due to recent changes in healthcare laws, you may see results of your pathology and/or laboratory studies on MyChart before the doctors have had a chance to review them. We understand that in some cases there may be results that are confusing or concerning to you. Please understand that not all results are received at the same time and often the doctors may need to interpret multiple results in order to provide you with the best plan of care or course of treatment. Therefore, we ask that you please give us 2 business days to thoroughly review all your results before contacting the office for clarification. Should we see a critical lab result, you will be contacted sooner.   If You Need Anything After Your Visit  If you have any questions or concerns for your doctor, please call our main line at [phone] and press option 4 to reach your doctor's medical assistant. If no one answers, please leave a voicemail as directed and we will return your call as soon as possible. Messages left after 4 pm will be answered the following business day.   You may also send us a message via MyChart. We typically respond to MyChart messages within 1-2 business days.  For prescription refills, please ask your pharmacy to contact our office. Our fax number is [phone].  If you have an urgent issue when the clinic is closed that cannot wait until the next business day, you can page your doctor at the number below.    Please note that while we do our best to be available for urgent issues outside of office hours, we are not available 24/7.   If you have an urgent issue and are unable to reach us, you may choose to seek medical care at your doctor's office, retail clinic, urgent care center, or emergency room.  If you have a medical emergency, please immediately call 911 or go to the emergency department.  Pager Numbers  - Dr. Kowalski: [phone]  - Dr. Moye: [phone]  - Dr. Stewart:  [phone]  In the event of inclement weather, please call our main line at [phone] for an update on the status of any delays or closures.  Dermatology Medication Tips: Please keep the boxes that topical medications come in in order to help keep track of the instructions about where and how to use these. Pharmacies typically print the medication instructions only on the boxes and not directly on the medication tubes.   If your medication is too expensive, please contact our office at [phone] option 4 or send us a message through MyChart.   We are unable to tell what your co-pay for medications will be in advance as this is different depending on your insurance coverage. However, we may be able to find a substitute medication at lower cost or fill out paperwork to get insurance to cover a needed medication.   If a prior authorization is required to get your medication covered by your insurance company, please allow us 1-2 business days to complete this process.  Drug prices often vary depending on where the prescription is filled and some pharmacies may offer cheaper prices.  The website www.goodrx.com contains coupons for medications through different pharmacies. The prices here do not account for what the cost may be with help from insurance (it may be cheaper with your insurance), but the website can give you the price if you did not use any insurance.  - You can print the associated coupon and take it with   your prescription to the pharmacy.  - You may also stop by our office during regular business hours and pick up a GoodRx coupon card.  - If you need your prescription sent electronically to a different pharmacy, notify our office through [place] MyChart or by phone at [phone] option 4.     Si Usted Necesita Algo Despus de Su Visita  Tambin puede enviarnos un mensaje a travs de MyChart. Por lo general respondemos a los mensajes de MyChart en el transcurso de 1 a 2  das hbiles.  Para renovar recetas, por favor pida a su farmacia que se ponga en contacto con nuestra oficina. Nuestro nmero de fax es el [phone].  Si tiene un asunto urgente cuando la clnica est cerrada y que no puede esperar hasta el siguiente da hbil, puede llamar/localizar a su doctor(a) al nmero que aparece a continuacin.   Por favor, tenga en cuenta que aunque hacemos todo lo posible para estar disponibles para asuntos urgentes fuera del horario de oficina, no estamos disponibles las 24 horas del da, los 7 das de la semana.   Si tiene un problema urgente y no puede comunicarse con nosotros, puede optar por buscar atencin mdica  en el consultorio de su doctor(a), en una clnica privada, en un centro de atencin urgente o en una sala de emergencias.  Si tiene una emergencia mdica, por favor llame inmediatamente al 911 o vaya a la sala de emergencias.  Nmeros de bper  - Dr. Kowalski: [phone]  - Dra. Moye: [phone]  - Dra. Stewart: [phone]  En caso de inclemencias del tiempo, por favor llame a nuestra lnea principal al [phone] para una actualizacin sobre el estado de cualquier retraso o cierre.  Consejos para la medicacin en dermatologa: Por favor, guarde las cajas en las que vienen los medicamentos de uso tpico para ayudarle a seguir las instrucciones sobre dnde y cmo usarlos. Las farmacias generalmente imprimen las instrucciones del medicamento slo en las cajas y no directamente en los tubos del medicamento.   Si su medicamento es muy caro, por favor, pngase en contacto con nuestra oficina llamando al [phone] y presione la opcin 4 o envenos un mensaje a travs de MyChart.   No podemos decirle cul ser su copago por los medicamentos por adelantado ya que esto es diferente dependiendo de la cobertura de su seguro. Sin embargo, es posible que podamos encontrar un medicamento sustituto a menor costo o llenar un formulario para que el  seguro cubra el medicamento que se considera necesario.   Si se requiere una autorizacin previa para que su compaa de seguros cubra su medicamento, por favor permtanos de 1 a 2 das hbiles para completar este proceso.  Los precios de los medicamentos varan con frecuencia dependiendo del lugar de dnde se surte la receta y alguna farmacias pueden ofrecer precios ms baratos.  El sitio web www.goodrx.com tiene cupones para medicamentos de diferentes farmacias. Los precios aqu no tienen en cuenta lo que podra costar con la ayuda del seguro (puede ser ms barato con su seguro), pero el sitio web puede darle el precio si no utiliz ningn seguro.  - Puede imprimir el cupn correspondiente y llevarlo con su receta a la farmacia.  - Tambin puede pasar por nuestra oficina durante el horario de atencin regular y recoger una tarjeta de cupones de GoodRx.  - Si necesita que su receta se enve electrnicamente a una farmacia diferente, informe a nuestra oficina a travs de MyChart de [place]   o por telfono llamando al [phone] y presione la opcin 4.  

## 2022-04-16 ENCOUNTER — Encounter: Payer: Self-pay | Admitting: Dermatology

## 2022-06-10 ENCOUNTER — Other Ambulatory Visit: Payer: Self-pay | Admitting: Dermatology

## 2022-06-10 DIAGNOSIS — L409 Psoriasis, unspecified: Secondary | ICD-10-CM

## 2022-07-01 ENCOUNTER — Telehealth: Payer: Self-pay

## 2022-07-01 ENCOUNTER — Other Ambulatory Visit: Payer: Self-pay

## 2022-07-01 MED ORDER — CALCIPOTRIENE 0.005 % EX SOLN: CUTANEOUS | 3 refills | Status: AC

## 2022-07-01 MED ORDER — BETAMETHASONE DIPROPIONATE 0.05 % EX LOTN: TOPICAL_LOTION | CUTANEOUS | 3 refills | Status: AC

## 2022-07-01 NOTE — Telephone Encounter (Signed)
Patient notified. Escripted to [employer]

## 2022-07-01 NOTE — Telephone Encounter (Signed)
Patient is mixing his Betamethasone lotion and Calcipotriene Solution to use on his scalp. Okay to refill those 2 medications for him?

## 2022-09-21 ENCOUNTER — Other Ambulatory Visit: Payer: Self-pay | Admitting: Dermatology

## 2022-09-21 DIAGNOSIS — E782 Mixed hyperlipidemia: Secondary | ICD-10-CM | POA: Diagnosis not present

## 2022-09-21 DIAGNOSIS — M47819 Spondylosis without myelopathy or radiculopathy, site unspecified: Secondary | ICD-10-CM | POA: Diagnosis not present

## 2022-09-21 DIAGNOSIS — R7302 Impaired glucose tolerance (oral): Secondary | ICD-10-CM | POA: Diagnosis not present

## 2022-09-21 DIAGNOSIS — Z79899 Other long term (current) drug therapy: Secondary | ICD-10-CM | POA: Diagnosis not present

## 2022-09-21 DIAGNOSIS — E669 Obesity, unspecified: Secondary | ICD-10-CM | POA: Diagnosis not present

## 2022-09-21 DIAGNOSIS — L409 Psoriasis, unspecified: Secondary | ICD-10-CM

## 2022-09-21 DIAGNOSIS — I10 Essential (primary) hypertension: Secondary | ICD-10-CM | POA: Diagnosis not present

## 2022-10-14 ENCOUNTER — Ambulatory Visit (INDEPENDENT_AMBULATORY_CARE_PROVIDER_SITE_OTHER): Payer: 59 | Admitting: Dermatology

## 2022-10-14 VITALS — BP 128/74 | HR 68

## 2022-10-14 DIAGNOSIS — Z79899 Other long term (current) drug therapy: Secondary | ICD-10-CM | POA: Diagnosis not present

## 2022-10-14 DIAGNOSIS — L400 Psoriasis vulgaris: Secondary | ICD-10-CM

## 2022-10-14 MED ORDER — SKYRIZI PEN 150 MG/ML ~~LOC~~ SOAJ: 150.0000 mg | SUBCUTANEOUS | 1 refills | Status: DC

## 2022-10-14 NOTE — Progress Notes (Signed)
   Follow-Up Visit   Subjective  Bryan Chandler. is a 51 y.o. male who presents for the following: Psoriasis (Of the scalp, trunk, and extremities. Pt currently using Calcipotriene/Betamethasone mix, Mometasone cream, and Cosentyx injections QM, but continues to flare. Previously discussed changing Cosentyx to [employer] and patient is interested in switching medication today).  The following portions of the chart were reviewed this encounter and updated as appropriate:   Allergies  Meds  Problems  Med Hx  Surg Hx  Fam Hx     Review of Systems:  No other skin or systemic complaints except as noted in HPI or Assessment and Plan.  Objective  Well appearing patient in no apparent distress; mood and affect are within normal limits.  A focused examination was performed including the face, trunk, extremities. Relevant physical exam findings are noted in the Assessment and Plan.  Trunk, extremities Well-demarcated erythematous papules/plaques with silvery scale, guttate pink scaly papules.                 Assessment & Plan  Plaque psoriasis Trunk, extremities BSA 15% while actively on Cosentyx - pt reports no j/a or pain Psoriasis is a chronic non-curable, but treatable genetic/hereditary disease that may have other systemic features affecting other organ systems such as joints (Psoriatic Arthritis). It is associated with an increased risk of inflammatory bowel disease, heart disease, non-alcoholic fatty liver disease, and depression.   Chronic and persistent condition with duration or expected duration over one year. Condition is symptomatic / bothersome to patient. Not to goal.  Discussed Bimzelx  but not covered by insurance.  Will plan to start [facility] pending insurance approval.   Patient is due for Cosentyx injection today, and he should go ahead and injection medication. Our office should know whether Cristy Folks is approved through his insurance before his next  Cosentyx injection.  Continue Calcipotriene/Betamethasone solution to aa's QD PRN, and Mometasone cream QD-BID PRN up to 5d/wk.  Risankizumab-rzaa (SKYRIZI PEN) 150 MG/ML SOAJ - Trunk, extremities Inject 150 mg into the skin as directed. At weeks 0 & 4. Risankizumab-rzaa (SKYRIZI PEN) 150 MG/ML SOAJ - Trunk, extremities Inject 150 mg into the skin as directed. Every 12 weeks for maintenance. Related Procedures QuantiFERON-TB Gold Plus  Return in about 3 months (around 01/13/2023) for psoriasis follow up. Documentation: I have reviewed the above documentation for accuracy and completeness, and I agree with the above.  Armida Sans, MD

## 2022-10-14 NOTE — Patient Instructions (Signed)
Due to recent changes in healthcare laws, you may see results of your pathology and/or laboratory studies on MyChart before the doctors have had a chance to review them. We understand that in some cases there may be results that are confusing or concerning to you. Please understand that not all results are received at the same time and often the doctors may need to interpret multiple results in order to provide you with the best plan of care or course of treatment. Therefore, we ask that you please give us 2 business days to thoroughly review all your results before contacting the office for clarification. Should we see a critical lab result, you will be contacted sooner.   If You Need Anything After Your Visit  If you have any questions or concerns for your doctor, please call our main line at [phone] and press option 4 to reach your doctor's medical assistant. If no one answers, please leave a voicemail as directed and we will return your call as soon as possible. Messages left after 4 pm will be answered the following business day.   You may also send us a message via MyChart. We typically respond to MyChart messages within 1-2 business days.  For prescription refills, please ask your pharmacy to contact our office. Our fax number is [phone].  If you have an urgent issue when the clinic is closed that cannot wait until the next business day, you can page your doctor at the number below.    Please note that while we do our best to be available for urgent issues outside of office hours, we are not available 24/7.   If you have an urgent issue and are unable to reach us, you may choose to seek medical care at your doctor's office, retail clinic, urgent care center, or emergency room.  If you have a medical emergency, please immediately call 911 or go to the emergency department.  Pager Numbers  - Dr. Kowalski: [phone]  - Dr. Moye: [phone]  - Dr. Stewart:  [phone]  In the event of inclement weather, please call our main line at [phone] for an update on the status of any delays or closures.  Dermatology Medication Tips: Please keep the boxes that topical medications come in in order to help keep track of the instructions about where and how to use these. Pharmacies typically print the medication instructions only on the boxes and not directly on the medication tubes.   If your medication is too expensive, please contact our office at [phone] option 4 or send us a message through MyChart.   We are unable to tell what your co-pay for medications will be in advance as this is different depending on your insurance coverage. However, we may be able to find a substitute medication at lower cost or fill out paperwork to get insurance to cover a needed medication.   If a prior authorization is required to get your medication covered by your insurance company, please allow us 1-2 business days to complete this process.  Drug prices often vary depending on where the prescription is filled and some pharmacies may offer cheaper prices.  The website www.goodrx.com contains coupons for medications through different pharmacies. The prices here do not account for what the cost may be with help from insurance (it may be cheaper with your insurance), but the website can give you the price if you did not use any insurance.  - You can print the associated coupon and take it with   your prescription to the pharmacy.  - You may also stop by our office during regular business hours and pick up a GoodRx coupon card.  - If you need your prescription sent electronically to a different pharmacy, notify our office through [place] MyChart or by phone at [phone] option 4.     Si Usted Necesita Algo Despus de Su Visita  Tambin puede enviarnos un mensaje a travs de MyChart. Por lo general respondemos a los mensajes de MyChart en el transcurso de 1 a 2  das hbiles.  Para renovar recetas, por favor pida a su farmacia que se ponga en contacto con nuestra oficina. Nuestro nmero de fax es el [phone].  Si tiene un asunto urgente cuando la clnica est cerrada y que no puede esperar hasta el siguiente da hbil, puede llamar/localizar a su doctor(a) al nmero que aparece a continuacin.   Por favor, tenga en cuenta que aunque hacemos todo lo posible para estar disponibles para asuntos urgentes fuera del horario de oficina, no estamos disponibles las 24 horas del da, los 7 das de la semana.   Si tiene un problema urgente y no puede comunicarse con nosotros, puede optar por buscar atencin mdica  en el consultorio de su doctor(a), en una clnica privada, en un centro de atencin urgente o en una sala de emergencias.  Si tiene una emergencia mdica, por favor llame inmediatamente al 911 o vaya a la sala de emergencias.  Nmeros de bper  - Dr. Kowalski: [phone]  - Dra. Moye: [phone]  - Dra. Stewart: [phone]  En caso de inclemencias del tiempo, por favor llame a nuestra lnea principal al [phone] para una actualizacin sobre el estado de cualquier retraso o cierre.  Consejos para la medicacin en dermatologa: Por favor, guarde las cajas en las que vienen los medicamentos de uso tpico para ayudarle a seguir las instrucciones sobre dnde y cmo usarlos. Las farmacias generalmente imprimen las instrucciones del medicamento slo en las cajas y no directamente en los tubos del medicamento.   Si su medicamento es muy caro, por favor, pngase en contacto con nuestra oficina llamando al [phone] y presione la opcin 4 o envenos un mensaje a travs de MyChart.   No podemos decirle cul ser su copago por los medicamentos por adelantado ya que esto es diferente dependiendo de la cobertura de su seguro. Sin embargo, es posible que podamos encontrar un medicamento sustituto a menor costo o llenar un formulario para que el  seguro cubra el medicamento que se considera necesario.   Si se requiere una autorizacin previa para que su compaa de seguros cubra su medicamento, por favor permtanos de 1 a 2 das hbiles para completar este proceso.  Los precios de los medicamentos varan con frecuencia dependiendo del lugar de dnde se surte la receta y alguna farmacias pueden ofrecer precios ms baratos.  El sitio web www.goodrx.com tiene cupones para medicamentos de diferentes farmacias. Los precios aqu no tienen en cuenta lo que podra costar con la ayuda del seguro (puede ser ms barato con su seguro), pero el sitio web puede darle el precio si no utiliz ningn seguro.  - Puede imprimir el cupn correspondiente y llevarlo con su receta a la farmacia.  - Tambin puede pasar por nuestra oficina durante el horario de atencin regular y recoger una tarjeta de cupones de GoodRx.  - Si necesita que su receta se enve electrnicamente a una farmacia diferente, informe a nuestra oficina a travs de MyChart de [place]   o por telfono llamando al [phone] y presione la opcin 4.  

## 2022-10-22 DIAGNOSIS — L400 Psoriasis vulgaris: Secondary | ICD-10-CM | POA: Diagnosis not present

## 2022-10-23 ENCOUNTER — Encounter: Payer: Self-pay | Admitting: Dermatology

## 2022-10-24 LAB — QUANTIFERON-TB GOLD PLUS
QuantiFERON Mitogen Value: >10 IU/mL
QuantiFERON Nil Value: 0.03 IU/mL
QuantiFERON TB1 Ag Value: 0.06 IU/mL
QuantiFERON TB2 Ag Value: 0.05 IU/mL
QuantiFERON-TB Gold Plus: NEGATIVE

## 2022-10-28 ENCOUNTER — Telehealth: Payer: Self-pay

## 2022-10-28 NOTE — Telephone Encounter (Signed)
-----   Message from David C Kowalski, MD sent at 10/27/2022  4:58 PM EST ----- Lab from 10/22/2022 showed. TB test = Quantiferon Gold - negative / normal. Pt switching from Cosentyx to Skyrizi for Psoriasis. Please check to see if Skyrizi approved by Insurance. If so, get him scheduled for starting dose. 

## 2022-10-28 NOTE — Telephone Encounter (Signed)
Called patient. N/A. LMOVM to return my call to discuss labs and medication. Skyrizi sent to [facility] 10/14/2022.

## 2022-11-01 ENCOUNTER — Telehealth: Payer: Self-pay

## 2022-11-01 ENCOUNTER — Other Ambulatory Visit: Payer: Self-pay

## 2022-11-01 DIAGNOSIS — L400 Psoriasis vulgaris: Secondary | ICD-10-CM

## 2022-11-01 MED ORDER — SKYRIZI PEN 150 MG/ML ~~LOC~~ SOAJ: 150.0000 mg | SUBCUTANEOUS | 1 refills | Status: DC

## 2022-11-01 NOTE — Progress Notes (Signed)
Skyrizi transferred to [facility] per insurance request. aw

## 2022-11-01 NOTE — Telephone Encounter (Signed)
Left msg for pt to return call.

## 2022-11-01 NOTE — Telephone Encounter (Signed)
-----   Message from David C Kowalski, MD sent at 10/27/2022  4:58 PM EST ----- Lab from 10/22/2022 showed. TB test = Quantiferon Gold - negative / normal. Pt switching from Cosentyx to Skyrizi for Psoriasis. Please check to see if Skyrizi approved by Insurance. If so, get him scheduled for starting dose. 

## 2022-11-04 ENCOUNTER — Telehealth: Payer: Self-pay

## 2022-11-04 NOTE — Telephone Encounter (Signed)
-----   Message from Ralene Bathe, MD sent at 10/27/2022  4:58 PM EST ----- Lab from 10/22/2022 showed. TB test = Quantiferon Gold - negative / normal. Pt switching from Cosentyx to [facility] for Psoriasis. Please check to see if Skyrizi approved by [employer]. If so, get him scheduled for starting dose.

## 2022-11-04 NOTE — Telephone Encounter (Signed)
Patient advised of lab results and Skyrizi sent in. aw

## 2023-01-12 ENCOUNTER — Ambulatory Visit: Payer: 59 | Admitting: Dermatology

## 2023-01-12 VITALS — BP 146/85 | HR 110

## 2023-01-12 DIAGNOSIS — L409 Psoriasis, unspecified: Secondary | ICD-10-CM | POA: Diagnosis not present

## 2023-01-12 DIAGNOSIS — Z7189 Other specified counseling: Secondary | ICD-10-CM

## 2023-01-12 DIAGNOSIS — Z79899 Other long term (current) drug therapy: Secondary | ICD-10-CM | POA: Diagnosis not present

## 2023-01-12 MED ORDER — RISANKIZUMAB-RZAA 150 MG/ML ~~LOC~~ SOSY
150.0000 mg | PREFILLED_SYRINGE | Freq: Once | SUBCUTANEOUS | Status: AC
  Administered 2023-01-12: 150 mg via SUBCUTANEOUS

## 2023-01-12 MED ORDER — SKYRIZI PEN 150 MG/ML ~~LOC~~ SOAJ: 150.0000 mg | SUBCUTANEOUS | 0 refills | Status: DC

## 2023-01-12 MED ORDER — CALCIPOTRIENE-BETAMETH DIPROP 0.005-0.064 % EX OINT: TOPICAL_OINTMENT | CUTANEOUS | 2 refills | Status: AC

## 2023-01-12 NOTE — Patient Instructions (Addendum)
Please bring with medication with you to next appointment for skyrizi  Please call numbers given at appointment about getting [place]. You have been approved with assistance.  Start calcipotriene betamethasone ointment - apply topically to any red areas of psoriasis on body nightly as needed. Avoid applying to face, groin, and axilla. Use as directed. Long-term use can cause thinning of the skin.  Topical steroids (such as triamcinolone, fluocinolone, fluocinonide, mometasone, clobetasol, halobetasol, betamethasone, hydrocortisone) can cause thinning and lightening of the skin if they are used for too long in the same area. Your physician has selected the right strength medicine for your problem and area affected on the body. Please use your medication only as directed by your physician to prevent side effects.      Due to recent changes in healthcare laws, you may see results of your pathology and/or laboratory studies on MyChart before the doctors have had a chance to review them. We understand that in some cases there may be results that are confusing or concerning to you. Please understand that not all results are received at the same time and often the doctors may need to interpret multiple results in order to provide you with the best plan of care or course of treatment. Therefore, we ask that you please give [place] 2 business days to thoroughly review all your results before contacting the office for clarification. Should we see a critical lab result, you will be contacted sooner.   If You Need Anything After Your Visit  If you have any questions or concerns for your doctor, please call our main line at [phone] and press option 4 to reach your doctor's medical assistant. If no one answers, please leave a voicemail as directed and we will return your call as soon as possible. Messages left after 4 pm will be answered the following business day.   You may also send [place] a message via [place].  We typically respond to MyChart messages within 1-2 business days.  For prescription refills, please ask your pharmacy to contact our office. Our fax number is [phone].  If you have an urgent issue when the clinic is closed that cannot wait until the next business day, you can page your doctor at the number below.    Please note that while we do our best to be available for urgent issues outside of office hours, we are not available 24/7.   If you have an urgent issue and are unable to reach [place], you may choose to seek medical care at your doctor's office, retail clinic, urgent care center, or emergency room.  If you have a medical emergency, please immediately call 911 or go to the emergency department.  Pager Numbers  - Dr. Nehemiah Massed: [phone]  - Dr. Laurence Ferrari: [phone]  - Dr. Nicole Kindred: [phone]  In the event of inclement weather, please call our main line at [phone] for an update on the status of any delays or closures.  Dermatology Medication Tips: Please keep the boxes that topical medications come in in order to help keep track of the instructions about where and how to use these. Pharmacies typically print the medication instructions only on the boxes and not directly on the medication tubes.   If your medication is too expensive, please contact our office at [phone] option 4 or send [place] a message through [place].   We are unable to tell what your co-pay for medications will be in advance as this is different depending on your insurance coverage.  However, we may be able to find a substitute medication at lower cost or fill out paperwork to get insurance to cover a needed medication.   If a prior authorization is required to get your medication covered by your insurance company, please allow [place] 1-2 business days to complete this process.  Drug prices often vary depending on where the prescription is filled and some pharmacies may offer cheaper prices.  The  website www.goodrx.com contains coupons for medications through different pharmacies. The prices here do not account for what the cost may be with help from insurance (it may be cheaper with your insurance), but the website can give you the price if you did not use any insurance.  - You can print the associated coupon and take it with your prescription to the pharmacy.  - You may also stop by our office during regular business hours and pick up a GoodRx coupon card.  - If you need your prescription sent electronically to a different pharmacy, notify our office through [facility] or by phone at [phone] option 4.     Si Usted Necesita Algo Despus de Su Visita  Tambin puede enviarnos un mensaje a travs de Pharmacist, community. Por lo general respondemos a los mensajes de MyChart en el transcurso de 1 a 2 das hbiles.  Para renovar recetas, por favor pida a su farmacia que se ponga en contacto con nuestra oficina. Harland Dingwall de fax es [place] [phone].  Si tiene un asunto urgente cuando la clnica est cerrada y que no puede esperar hasta el siguiente da hbil, puede llamar/localizar a su doctor(a) al nmero que aparece a continuacin.   Por favor, tenga en cuenta que aunque hacemos todo lo posible para estar disponibles para asuntos urgentes fuera del horario de [place], no estamos disponibles las 24 horas del da, los 7 das de la [place].   Si tiene un problema urgente y no puede comunicarse con nosotros, puede optar por buscar atencin mdica  en el consultorio de su doctor(a), en una clnica privada, en un centro de atencin urgente o en una sala de emergencias.  Si tiene Engineering geologist, por favor llame inmediatamente al 911 o vaya a la sala de emergencias.  Nmeros de bper  - Dr. Nehemiah Massed: [phone]  - Dra. Moye: [phone]  - Dra. Nicole Kindred: [phone]  En caso de inclemencias del [place], por favor llame a Johnsie Kindred principal al [phone] para una  actualizacin sobre el [place] de cualquier retraso o cierre.  Consejos para la medicacin en dermatologa: Por favor, guarde las cajas en las que vienen los medicamentos de uso tpico para ayudarle a seguir las instrucciones sobre dnde y cmo usarlos. Las farmacias generalmente imprimen las instrucciones del medicamento slo en las cajas y no directamente en los tubos del [place].   Si su medicamento es muy caro, por favor, pngase en contacto con Zigmund Daniel llamando al [phone] y presione la opcin 4 o envenos un mensaje a travs de Pharmacist, community.   No podemos decirle cul ser su copago por los medicamentos por adelantado ya que esto es diferente dependiendo de la cobertura de su seguro. Sin embargo, es posible que podamos encontrar un medicamento sustituto a Electrical engineer un formulario para que el seguro cubra el medicamento que se considera necesario.   Si se requiere una autorizacin previa para que su compaa de seguros [place] su medicamento, por favor permtanos de 1 a 2 das hbiles para completar este proceso.  Los precios de  de los medicamentos varan con frecuencia dependiendo del lugar de dnde se surte la receta y alguna farmacias pueden ofrecer precios ms baratos.  El sitio web www.goodrx.com tiene cupones para medicamentos de diferentes farmacias. Los precios aqu no tienen en cuenta lo que podra costar con la ayuda del seguro (puede ser ms barato con su seguro), pero el sitio web puede darle el precio si no utiliz ningn seguro.  - Puede imprimir el cupn correspondiente y llevarlo con su receta a la farmacia.  - Tambin puede pasar por nuestra oficina durante el horario de atencin regular y recoger una tarjeta de cupones de GoodRx.  - Si necesita que su receta se enve electrnicamente a una farmacia diferente, informe a nuestra oficina a travs de MyChart de [place] o por telfono llamando al [phone] y presione la opcin 4.  

## 2023-01-12 NOTE — Progress Notes (Signed)
   Follow-Up Visit   Subjective  Bryan Chandler. is a 52 y.o. male who presents for the following: Psoriasis 3 month followup, flared at arms, legs, and back. Only using topicals to treat. Has not been able to get Skirizi yet.   The following portions of the chart were reviewed this encounter and updated as appropriate: medications, allergies, medical history  Review of Systems:  No other skin or systemic complaints except as noted in HPI or Assessment and Plan.  Objective  Well appearing patient in no apparent distress; mood and affect are within normal limits.  Areas Examined: Trunk, scalp, extremties     Assessment & Plan   Psoriasis  Related Medications mometasone (ELOCON) 0.1 % cream Apply topically daily as needed (Rash). Apply to aa's psoriasis QD-BID PRN up to 5 days per week.  Risankizumab-rzaa SOSY 150 mg   calcipotriene-betamethasone (TACLONEX) ointment Apply topically to any red areas of psoriasis on body qhs prn. Avoid applying to face, groin, and axilla. Use as directed. Long-term use can cause thinning of the skin.    PSORIASIS Well-demarcated erythematous papules/plaques with silvery scale, guttate pink scaly papules.  Back, umbiliculus, flanks, scalp occipital, lower leg, arms  Severe and extensive.  Chronic and persistent condition with duration or expected duration over one year. Condition is bothersome/symptomatic for patient. Currently flared.   Treatment Plan:  Patient approved for [place], information given to contact [place] assistance   Counseling on psoriasis and coordination of care  psoriasis is a chronic non-curable, but treatable genetic/hereditary disease that may have other systemic features affecting other organ systems such as joints (Psoriatic Arthritis). It is associated with an increased risk of inflammatory bowel disease, heart disease, non-alcoholic fatty liver disease, and depression.  Treatments include light and laser  treatments; topical medications; and systemic medications including oral and injectables.  Initial sample Skyrizi 150 mg / ml syringe injected today into patient left arm. Patient tolerated well with no adverse reactions.   Exp aug 2024 Lot [id number]  Start Taclonex (calcipotriene betamethasone) ointment - apply topically to aa of psoriasis at body qhs prn. Avoid applying to face, groin, and axilla. Use as directed. Long-term use can cause thinning of the skin.  Continue Calcipotriene and Betamethasone solutions to aa's scalp QD PRN, and Mometasone cream QD-BID PRN.  Topical steroids (such as triamcinolone, fluocinolone, fluocinonide, mometasone, clobetasol, halobetasol, betamethasone, hydrocortisone) can cause thinning and lightening of the skin if they are used for too long in the same area. Your physician has selected the right strength medicine for your problem and area affected on the body. Please use your medication only as directed by your physician to prevent side effects.   Reviewed risks of biologics including immunosuppression, infections, injection site reaction, and failure to improve condition. Goal is control of skin condition, not cure.  Some older biologics such as Humira and Enbrel may slightly increase risk of malignancy and may worsen congestive heart failure.  Taltz and Cosentyx may cause inflammatory bowel disease to flare. The use of biologics requires long term medication management, including periodic office visits and monitoring of blood work.    Return for 1 month psoriasis follow up.  I, Ruthell Rummage, CMA, am acting as scribe for Brendolyn Patty, MD.   Documentation: I have reviewed the above documentation for accuracy and completeness, and I agree with the above.  Brendolyn Patty, MD

## 2023-01-13 ENCOUNTER — Ambulatory Visit: Payer: 59 | Admitting: Dermatology

## 2023-02-21 ENCOUNTER — Encounter: Payer: 59 | Admitting: Dermatology

## 2023-02-21 NOTE — Progress Notes (Deleted)
   Follow-Up Visit   Subjective  Bryan Chandler. is a 52 y.o. male who presents for the following: Psoriasis, skyrizi  ***  The following portions of the chart were reviewed this encounter and updated as appropriate: medications, allergies, medical history  Review of Systems:  No other skin or systemic complaints except as noted in HPI or Assessment and Plan.  Objective  Well appearing patient in no apparent distress; mood and affect are within normal limits.  Areas Examined: ***  Relevant exam findings are noted in the Assessment and Plan.      Assessment & Plan     PSORIASIS Well-demarcated erythematous papules/plaques with silvery scale, guttate pink scaly papules. ***% BSA.  wellcontrolled vs notatgoal vs flared***  Patient denies joint pain***  Treatment Plan: ***  Counseling on psoriasis and coordination of care  psoriasis is a chronic non-curable, but treatable genetic/hereditary disease that may have other systemic features affecting other organ systems such as joints (Psoriatic Arthritis). It is associated with an increased risk of inflammatory bowel disease, heart disease, non-alcoholic fatty liver disease, and depression.  Treatments include light and laser treatments; topical medications; and systemic medications including oral and injectables.    No follow-ups on file.  ***  Documentation: I have reviewed the above documentation for accuracy and completeness, and I agree with the above.  Willeen Niece, MD

## 2023-02-28 ENCOUNTER — Ambulatory Visit: Payer: 59 | Admitting: Dermatology

## 2023-02-28 VITALS — BP 127/71 | HR 107

## 2023-02-28 DIAGNOSIS — L409 Psoriasis, unspecified: Secondary | ICD-10-CM

## 2023-02-28 DIAGNOSIS — Z79899 Other long term (current) drug therapy: Secondary | ICD-10-CM

## 2023-02-28 MED ORDER — RISANKIZUMAB-RZAA 150 MG/ML ~~LOC~~ SOAJ
150.0000 mg | Freq: Once | SUBCUTANEOUS | Status: AC
  Administered 2023-02-28: 150 mg via SUBCUTANEOUS

## 2023-02-28 NOTE — Patient Instructions (Signed)
Reviewed risks of biologics including immunosuppression, infections, injection site reaction, and failure to improve condition. Goal is control of skin condition, not cure.  Some older biologics such as Humira and Enbrel may slightly increase risk of malignancy and may worsen congestive heart failure.  Taltz and Cosentyx may cause inflammatory bowel disease to flare. The use of biologics requires long term medication management, including periodic office visits and monitoring of blood work.     Due to recent changes in healthcare laws, you may see results of your pathology and/or laboratory studies on MyChart before the doctors have had a chance to review them. We understand that in some cases there may be results that are confusing or concerning to you. Please understand that not all results are received at the same time and often the doctors may need to interpret multiple results in order to provide you with the best plan of care or course of treatment. Therefore, we ask that you please give us 2 business days to thoroughly review all your results before contacting the office for clarification. Should we see a critical lab result, you will be contacted sooner.   If You Need Anything After Your Visit  If you have any questions or concerns for your doctor, please call our main line at [phone] and press option 4 to reach your doctor's medical assistant. If no one answers, please leave a voicemail as directed and we will return your call as soon as possible. Messages left after 4 pm will be answered the following business day.   You may also send us a message via MyChart. We typically respond to MyChart messages within 1-2 business days.  For prescription refills, please ask your pharmacy to contact our office. Our fax number is [phone].  If you have an urgent issue when the clinic is closed that cannot wait until the next business day, you can page your doctor at the number below.    Please  note that while we do our best to be available for urgent issues outside of office hours, we are not available 24/7.   If you have an urgent issue and are unable to reach us, you may choose to seek medical care at your doctor's office, retail clinic, urgent care center, or emergency room.  If you have a medical emergency, please immediately call 911 or go to the emergency department.  Pager Numbers  - Dr. Kowalski: [phone]  - Dr. Moye: [phone]  - Dr. Stewart: [phone]  In the event of inclement weather, please call our main line at [phone] for an update on the status of any delays or closures.  Dermatology Medication Tips: Please keep the boxes that topical medications come in in order to help keep track of the instructions about where and how to use these. Pharmacies typically print the medication instructions only on the boxes and not directly on the medication tubes.   If your medication is too expensive, please contact our office at [phone] option 4 or send us a message through MyChart.   We are unable to tell what your co-pay for medications will be in advance as this is different depending on your insurance coverage. However, we may be able to find a substitute medication at lower cost or fill out paperwork to get insurance to cover a needed medication.   If a prior authorization is required to get your medication covered by your insurance company, please allow us 1-2 business days to complete this process.  Drug prices   often vary depending on where the prescription is filled and some pharmacies may offer cheaper prices.  The website www.goodrx.com contains coupons for medications through different pharmacies. The prices here do not account for what the cost may be with help from insurance (it may be cheaper with your insurance), but the website can give you the price if you did not use any insurance.  - You can print the associated coupon and take it with  your prescription to the pharmacy.  - You may also stop by our office during regular business hours and pick up a GoodRx coupon card.  - If you need your prescription sent electronically to a different pharmacy, notify our office through [place] MyChart or by phone at [phone] option 4.     Si Usted Necesita Algo Despus de Su Visita  Tambin puede enviarnos un mensaje a travs de MyChart. Por lo general respondemos a los mensajes de MyChart en el transcurso de 1 a 2 das hbiles.  Para renovar recetas, por favor pida a su farmacia que se ponga en contacto con nuestra oficina. Nuestro nmero de fax es el [phone].  Si tiene un asunto urgente cuando la clnica est cerrada y que no puede esperar hasta el siguiente da hbil, puede llamar/localizar a su doctor(a) al nmero que aparece a continuacin.   Por favor, tenga en cuenta que aunque hacemos todo lo posible para estar disponibles para asuntos urgentes fuera del horario de oficina, no estamos disponibles las 24 horas del da, los 7 das de la semana.   Si tiene un problema urgente y no puede comunicarse con nosotros, puede optar por buscar atencin mdica  en el consultorio de su doctor(a), en una clnica privada, en un centro de atencin urgente o en una sala de emergencias.  Si tiene una emergencia mdica, por favor llame inmediatamente al 911 o vaya a la sala de emergencias.  Nmeros de bper  - Dr. Kowalski: [phone]  - Dra. Moye: [phone]  - Dra. Stewart: [phone]  En caso de inclemencias del tiempo, por favor llame a nuestra lnea principal al [phone] para una actualizacin sobre el estado de cualquier retraso o cierre.  Consejos para la medicacin en dermatologa: Por favor, guarde las cajas en las que vienen los medicamentos de uso tpico para ayudarle a seguir las instrucciones sobre dnde y cmo usarlos. Las farmacias generalmente imprimen las instrucciones del medicamento slo en las cajas y  no directamente en los tubos del medicamento.   Si su medicamento es muy caro, por favor, pngase en contacto con nuestra oficina llamando al [phone] y presione la opcin 4 o envenos un mensaje a travs de MyChart.   No podemos decirle cul ser su copago por los medicamentos por adelantado ya que esto es diferente dependiendo de la cobertura de su seguro. Sin embargo, es posible que podamos encontrar un medicamento sustituto a menor costo o llenar un formulario para que el seguro cubra el medicamento que se considera necesario.   Si se requiere una autorizacin previa para que su compaa de seguros cubra su medicamento, por favor permtanos de 1 a 2 das hbiles para completar este proceso.  Los precios de los medicamentos varan con frecuencia dependiendo del lugar de dnde se surte la receta y alguna farmacias pueden ofrecer precios ms baratos.  El sitio web www.goodrx.com tiene cupones para medicamentos de diferentes farmacias. Los precios aqu no tienen en cuenta lo que podra costar con la ayuda del seguro (puede ser ms barato con   su seguro), pero el sitio web puede darle el precio si no utiliz ningn seguro.  - Puede imprimir el cupn correspondiente y llevarlo con su receta a la farmacia.  - Tambin puede pasar por nuestra oficina durante el horario de atencin regular y recoger una tarjeta de cupones de GoodRx.  - Si necesita que su receta se enve electrnicamente a una farmacia diferente, informe a nuestra oficina a travs de MyChart de [place] o por telfono llamando al [phone] y presione la opcin 4.  

## 2023-02-28 NOTE — Progress Notes (Signed)
Follow-Up Visit   Subjective  Bryan Chandler. is a 52 y.o. male who presents for the following: 1 month follow-up Psoriasis of the arms, legs, back. Patient had initial Skyrizi injection at last visit. He has seen improvement in his psoriasis. He has calcipotriene betamethasone solution and mometasone cream, but hasn't needed them. No h/o joint pain.    The following portions of the chart were reviewed this encounter and updated as appropriate: medications, allergies, medical history  Review of Systems:  No other skin or systemic complaints except as noted in HPI or Assessment and Plan.  Objective  Well appearing patient in no apparent distress; mood and affect are within normal limits.  Areas Examined: Face, trunk, extremities  Relevant exam findings are noted in the Assessment and Plan.      Assessment & Plan   Psoriasis  Related Medications mometasone (ELOCON) 0.1 % cream Apply topically daily as needed (Rash). Apply to aa's psoriasis QD-BID PRN up to 5 days per week.  calcipotriene-betamethasone (TACLONEX) ointment Apply topically to any red areas of psoriasis on body qhs prn. Avoid applying to face, groin, and axilla. Use as directed. Long-term use can cause thinning of the skin.  Risankizumab-rzaa SOAJ 150 mg     PSORIASIS Pink hyperpigmented patches on the arms, legs; scalp clear; pink patches with thinning, mild scale of the back, flanks.   20% BSA.   Chronic and persistent condition with duration or expected duration over one year. Condition is symptomatic/ bothersome to patient. Not currently at goal, but improving.    Patient denies joint pain  Treatment Plan: 2nd Skyrizi 150 mg/mL injection given today in the right upper arm. Patient tolerated well. Patient brought injection from home.  Lot [id number] Exp 01/2024 NDC 4540-9811-91  Continue Skyrizi 150 mg/mL q 12 weeks. Continue calcipotriene betamethasone lotion qd prn flares. Continue mometasone  cream qd/bid prn flares.  Start Taclonex ointment prn flares. Pt has Rx at pharmacy.   Topical steroids (such as triamcinolone, fluocinolone, fluocinonide, mometasone, clobetasol, halobetasol, betamethasone, hydrocortisone) can cause thinning and lightening of the skin if they are used for too long in the same area. Your physician has selected the right strength medicine for your problem and area affected on the body. Please use your medication only as directed by your physician to prevent side effects.   Reviewed risks of biologics including immunosuppression, infections, injection site reaction, and failure to improve condition. Goal is control of skin condition, not cure.  Some older biologics such as Humira and Enbrel may slightly increase risk of malignancy and may worsen congestive heart failure.  Taltz and Cosentyx may cause inflammatory bowel disease to flare. The use of biologics requires long term medication management, including periodic office visits and monitoring of blood work.   Counseling and coordination of care for severe psoriasis on systemic treatment  psoriasis - severe on systemic treatment.  Psoriasis is a chronic non-curable, but treatable genetic/hereditary disease that may have other systemic features affecting other organ systems such as joints (Psoriatic Arthritis).  It is linked with heart disease, inflammatory bowel disease, non-alcoholic fatty liver disease, and depression. Significant skin psoriasis and/or psoriatic arthritis may have significant symptoms and affects activities of daily activity and often benefits from systemic treatments.  These systemic treatments have some potential side effects including immunosuppression and require pre-treatment laboratory screening and periodic laboratory monitoring and periodic in person evaluation and monitoring by the attending dermatologist physician (long term medication management).     Return in  about 3 months (around  05/31/2023) for Psoriasis, Skyrizi. (Dr Kirtland Bouchard).  ICherlyn Labella, CMA, am acting as scribe for Willeen Niece, MD .   Documentation: I have reviewed the above documentation for accuracy and completeness, and I agree with the above.  Willeen Niece, MD

## 2023-03-31 DIAGNOSIS — R7302 Impaired glucose tolerance (oral): Secondary | ICD-10-CM | POA: Diagnosis not present

## 2023-03-31 DIAGNOSIS — Z79899 Other long term (current) drug therapy: Secondary | ICD-10-CM | POA: Diagnosis not present

## 2023-03-31 DIAGNOSIS — L409 Psoriasis, unspecified: Secondary | ICD-10-CM | POA: Diagnosis not present

## 2023-03-31 DIAGNOSIS — E669 Obesity, unspecified: Secondary | ICD-10-CM | POA: Diagnosis not present

## 2023-03-31 DIAGNOSIS — Z0000 Encounter for general adult medical examination without abnormal findings: Secondary | ICD-10-CM | POA: Diagnosis not present

## 2023-03-31 DIAGNOSIS — M47819 Spondylosis without myelopathy or radiculopathy, site unspecified: Secondary | ICD-10-CM | POA: Diagnosis not present

## 2023-03-31 DIAGNOSIS — R600 Localized edema: Secondary | ICD-10-CM | POA: Diagnosis not present

## 2023-03-31 DIAGNOSIS — I10 Essential (primary) hypertension: Secondary | ICD-10-CM | POA: Diagnosis not present

## 2023-03-31 DIAGNOSIS — E782 Mixed hyperlipidemia: Secondary | ICD-10-CM | POA: Diagnosis not present

## 2023-03-31 DIAGNOSIS — M7989 Other specified soft tissue disorders: Secondary | ICD-10-CM | POA: Diagnosis not present

## 2023-06-01 ENCOUNTER — Ambulatory Visit: Payer: 59 | Admitting: Dermatology

## 2023-06-01 DIAGNOSIS — Z79899 Other long term (current) drug therapy: Secondary | ICD-10-CM

## 2023-06-01 DIAGNOSIS — Z7189 Other specified counseling: Secondary | ICD-10-CM | POA: Diagnosis not present

## 2023-06-01 DIAGNOSIS — L409 Psoriasis, unspecified: Secondary | ICD-10-CM | POA: Diagnosis not present

## 2023-06-01 MED ORDER — VTAMA 1 % EX CREA: TOPICAL_CREAM | CUTANEOUS | 1 refills | Status: AC

## 2023-06-01 MED ORDER — RISANKIZUMAB-RZAA 150 MG/ML ~~LOC~~ SOAJ
150.0000 mg | Freq: Once | SUBCUTANEOUS | Status: AC
Start: 2023-06-01 — End: 2023-06-01
  Administered 2023-06-01: 150 mg via SUBCUTANEOUS

## 2023-06-01 NOTE — Patient Instructions (Addendum)
Start vtama cream - apply thin amount to affected areas of psoriasis once daily for flares    Your prescription was sent to [facility] in [place]. A representative from [facility] will contact you within 3 business hours to verify your address and insurance information to schedule a free delivery. If for any reason you do not receive a phone call from them, please reach out to them. Their phone number is [phone] and their hours are Monday-Friday 9:00 am-5:00 pm.     Due to recent changes in healthcare laws, you may see results of your pathology and/or laboratory studies on MyChart before the doctors have had a chance to review them. We understand that in some cases there may be results that are confusing or concerning to you. Please understand that not all results are received at the same time and often the doctors may need to interpret multiple results in order to provide you with the best plan of care or course of treatment. Therefore, we ask that you please give [place] 2 business days to thoroughly review all your results before contacting the office for clarification. Should we see a critical lab result, you will be contacted sooner.   If You Need Anything After Your Visit  If you have any questions or concerns for your doctor, please call our main line at [phone] and press option 4 to reach your doctor's medical assistant. If no one answers, please leave a voicemail as directed and we will return your call as soon as possible. Messages left after 4 pm will be answered the following business day.   You may also send [place] a message via MyChart. We typically respond to MyChart messages within 1-2 business days.  For prescription refills, please ask your pharmacy to contact our office. Our fax number is [phone].  If you have an urgent issue when the clinic is closed that cannot wait until the next business day, you can page your doctor at the number below.    Please note  that while we do our best to be available for urgent issues outside of office hours, we are not available 24/7.   If you have an urgent issue and are unable to reach [place], you may choose to seek medical care at your doctor's office, retail clinic, urgent care center, or emergency room.  If you have a medical emergency, please immediately call 911 or go to the emergency department.  Pager Numbers  - Dr. Gwen Pounds: [phone]  - Dr. Roseanne Reno: [phone]  In the event of inclement weather, please call our main line at [phone] for an update on the status of any delays or closures.  Dermatology Medication Tips: Please keep the boxes that topical medications come in in order to help keep track of the instructions about where and how to use these. Pharmacies typically print the medication instructions only on the boxes and not directly on the medication tubes.   If your medication is too expensive, please contact our office at [phone] option 4 or send [place] a message through MyChart.   We are unable to tell what your co-pay for medications will be in advance as this is different depending on your insurance coverage. However, we may be able to find a substitute medication at lower cost or fill out paperwork to get insurance to cover a needed medication.   If a prior authorization is required to get your medication covered by your insurance company, please allow [place] 1-2 business days to complete  this process.  Drug prices often vary depending on where the prescription is filled and some pharmacies may offer cheaper prices.  The website www.goodrx.com contains coupons for medications through different pharmacies. The prices here do not account for what the cost may be with help from insurance (it may be cheaper with your insurance), but the website can give you the price if you did not use any insurance.  - You can print the associated coupon and take it with your prescription to the pharmacy.   - You may also stop by our office during regular business hours and pick up a GoodRx coupon card.  - If you need your prescription sent electronically to a different pharmacy, notify our office through [facility] or by phone at [phone] option 4.     Si Usted Necesita Algo Despus de Su Visita  Tambin puede enviarnos un mensaje a travs de Clinical cytogeneticist. Por lo general respondemos a los mensajes de MyChart en el transcurso de 1 a 2 das hbiles.  Para renovar recetas, por favor pida a su farmacia que se ponga en contacto con nuestra oficina. Annie Sable de fax es [place] [phone].  Si tiene un asunto urgente cuando la clnica est cerrada y que no puede esperar hasta el siguiente da hbil, puede llamar/localizar a su doctor(a) al nmero que aparece a continuacin.   Por favor, tenga en cuenta que aunque hacemos todo lo posible para estar disponibles para asuntos urgentes fuera del horario de [employer], no estamos disponibles las 24 horas del da, los 7 [address]  [address] de la [place].   Si tiene un problema urgente y no puede comunicarse con nosotros, puede optar por buscar atencin mdica  en el consultorio de su doctor(a), en una clnica privada, en un centro de atencin urgente o en una sala de emergencias.  Si tiene Engineer, drilling, por favor llame inmediatamente al 911 o vaya a la sala de emergencias.  Nmeros de bper  - Dr. Gwen Pounds: [phone]  - Dra. Roseanne Reno: [phone]  En caso de inclemencias del [place], por favor llame a Lacy Duverney principal al [phone] para una actualizacin sobre el [place] de cualquier retraso o cierre.  Consejos para la medicacin en dermatologa: Por favor, guarde las cajas en las que vienen los medicamentos de uso tpico para ayudarle a seguir las instrucciones sobre dnde y cmo usarlos. Las farmacias generalmente imprimen las instrucciones del medicamento slo en las cajas y no directamente en los tubos del [place].   Si su  medicamento es muy caro, por favor, pngase en contacto con Rolm Gala llamando al [phone] y presione la opcin 4 o envenos un mensaje a travs de Clinical cytogeneticist.   No podemos decirle cul ser su copago por los medicamentos por adelantado ya que esto es diferente dependiendo de la cobertura de su seguro. Sin embargo, es posible que podamos encontrar un medicamento sustituto a Audiological scientist un formulario para que el seguro cubra el medicamento que se considera necesario.   Si se requiere una autorizacin previa para que su compaa de seguros [place] su medicamento, por favor permtanos de 1 a 2 das hbiles para completar [address]  Los precios de los medicamentos varan con frecuencia dependiendo del Environmental consultant de dnde se surte la receta y alguna farmacias pueden ofrecer precios ms baratos.  El sitio web www.goodrx.com tiene cupones para medicamentos de Health and safety inspector. Los precios aqu no tienen en cuenta lo que podra costar con la ayuda del seguro (puede ser ms barato con  su seguro), pero el sitio web puede darle el precio si no Visual merchandiser.  - Puede imprimir el cupn correspondiente y llevarlo con su receta a la farmacia.  - Tambin puede pasar por nuestra oficina durante el horario de atencin regular y Education officer, museum una tarjeta de cupones de GoodRx.  - Si necesita que su receta se enve electrnicamente a una farmacia diferente, informe a nuestra oficina a travs de MyChart de [place] o por telfono llamando al [phone] y presione la opcin 4.

## 2023-06-01 NOTE — Progress Notes (Signed)
   Follow-Up Visit   Subjective  Bryan Chandler. is a 52 y.o. male who presents for the following: 3 month follow up on psoriasis. Patient currently having flares at b/l flank areas. Currently on skyrizi injections every 3 months.   The following portions of the chart were reviewed this encounter and updated as appropriate: medications, allergies, medical history  Review of Systems:  No other skin or systemic complaints except as noted in HPI or Assessment and Plan.  Objective  Well appearing patient in no apparent distress; mood and affect are within normal limits.  Areas Examined: Back, b/l flank , abdomen, arms, elbows, hands, scalp, face  Relevant exam findings are noted in the Assessment and Plan.           Assessment & Plan    PSORIASIS pink scaly papules at b/l flank and back see above photos 10 % BSA.on skyrizi   Chronic and persistent condition with duration or expected duration over one year. Condition is bothersome/symptomatic for patient. Currently flared.  Counseling and coordination of care for severe psoriasis on systemic treatment  Psoriasis - severe on systemic treatment.  Psoriasis is a chronic non-curable, but treatable genetic/hereditary disease that may have other systemic features affecting other organ systems such as joints (Psoriatic Arthritis).  It is linked with heart disease, inflammatory bowel disease, non-alcoholic fatty liver disease, and depression. Significant skin psoriasis and/or psoriatic arthritis may have significant symptoms and affects activities of daily activity and often benefits from systemic treatments.  These systemic treatments have some potential side effects including immunosuppression and require pre-treatment laboratory screening and periodic laboratory monitoring and periodic in person evaluation and monitoring by the attending dermatologist physician (long term medication management).   Patient injected with Skyrizi 150  mg/ml into left abdomen. Patient tolerated well with no adverse reactions  NDC 0074-2100-01 Lot: [id number] Exp 07/2024  Treatment Plan: Continue Skyrizi injection q3 months patient reports he will start having his mom give him injections  Start vtama cream - apply thin layer topically to aa qd  Samples given.  Rx sent to [facility].   If not covered by patient insurance will send rx of Zoryve cream.   Reviewed risks of biologics including immunosuppression, infections, injection site reaction, and failure to improve condition. Goal is control of skin condition, not cure.  Some older biologics such as Humira and Enbrel may slightly increase risk of malignancy and may worsen congestive heart failure.  Taltz and Cosentyx may cause inflammatory bowel disease to flare. The use of biologics requires long term medication management, including periodic office visits and monitoring of blood work.   Long term medication management.  Patient is using long term (months to years) prescription medication  to control their dermatologic condition.  These medications require periodic monitoring to evaluate for efficacy and side effects and may require periodic laboratory monitoring.   Return for 3 month psoriasis follow up with skyrizi shot.  IAsher Muir, CMA, am acting as scribe for Armida Sans, MD.  Documentation: I have reviewed the above documentation for accuracy and completeness, and I agree with the above.  Armida Sans, MD

## 2023-06-10 ENCOUNTER — Encounter: Payer: Self-pay | Admitting: Dermatology

## 2023-09-14 ENCOUNTER — Ambulatory Visit (INDEPENDENT_AMBULATORY_CARE_PROVIDER_SITE_OTHER): Payer: 59 | Admitting: Dermatology

## 2023-09-14 DIAGNOSIS — D225 Melanocytic nevi of trunk: Secondary | ICD-10-CM | POA: Diagnosis not present

## 2023-09-14 DIAGNOSIS — L400 Psoriasis vulgaris: Secondary | ICD-10-CM

## 2023-09-14 DIAGNOSIS — Z79899 Other long term (current) drug therapy: Secondary | ICD-10-CM | POA: Diagnosis not present

## 2023-09-14 DIAGNOSIS — Z7189 Other specified counseling: Secondary | ICD-10-CM

## 2023-09-14 DIAGNOSIS — L409 Psoriasis, unspecified: Secondary | ICD-10-CM | POA: Diagnosis not present

## 2023-09-14 MED ORDER — SKYRIZI PEN 150 MG/ML ~~LOC~~ SOAJ: 150.0000 mg | SUBCUTANEOUS | 1 refills | Status: DC

## 2023-09-14 NOTE — Patient Instructions (Signed)

## 2023-09-14 NOTE — Progress Notes (Signed)
Follow-Up Visit   Subjective  Bryan Chandler. is a 52 y.o. male who presents for the following: Psoriasis trunk, Skyrizi sq injections q 60m The patient has spots, moles and lesions to be evaluated, some may be new or changing and the patient may have concern these could be cancer.   The following portions of the chart were reviewed this encounter and updated as appropriate: medications, allergies, medical history  Review of Systems:  No other skin or systemic complaints except as noted in HPI or Assessment and Plan.  Objective  Well appearing patient in no apparent distress; mood and affect are within normal limits.   A focused examination was performed of the following areas: Trunk, arms, legs, scalp, face  Relevant exam findings are noted in the Assessment and Plan.    Assessment & Plan   PSORIASIS Trunk, arms, legs, scalp, ears Labs from 03/31/23 viewed Exam: hyperpigmentation low back, arms and buttock at previous areas of psoriasis, mild thickening of elbows  5% BSA. On treatment  Chronic and persistent condition with duration or expected duration over one year. Condition is improving with treatment but not currently at goal.   patient denies joint pain  Counseling and coordination of care for severe psoriasis on systemic treatment  psoriasis - severe on systemic treatment.  Psoriasis is a chronic non-curable, but treatable genetic/hereditary disease that may have other systemic features affecting other organ systems such as joints (Psoriatic Arthritis).  It is linked with heart disease, inflammatory bowel disease, non-alcoholic fatty liver disease, and depression. Significant skin psoriasis and/or psoriatic arthritis may have significant symptoms and affects activities of daily activity and often benefits from systemic treatments.  These systemic treatments have some potential side effects including immunosuppression and require pre-treatment laboratory screening and  periodic laboratory monitoring and periodic in person evaluation and monitoring by the attending dermatologist physician (long term medication management).  Reviewed risks of biologics including immunosuppression, infections, injection site reaction, and failure to improve condition. Goal is control of skin condition, not cure.  Some older biologics such as Humira and Enbrel may slightly increase risk of malignancy and may worsen congestive heart failure.  Taltz and Cosentyx may cause inflammatory bowel disease to flare. The use of biologics requires long term medication management, including periodic office visits and monitoring of blood work.    Treatment Plan: Cont Skyrizi sq injections q 78m  Reviewed risks of biologics including immunosuppression, infections, injection site reaction, and failure to improve condition. Goal is control of skin condition, not cure.  Some older biologics such as Humira and Enbrel may slightly increase risk of malignancy and may worsen congestive heart failure.  Taltz and Cosentyx may cause inflammatory bowel disease to flare. The use of biologics requires long term medication management, including periodic office visits and monitoring of blood work.   CONGENITAL NEVUS L low back Exam: brown macule L low back  Treatment Plan: Benign-appearing.  Observation.  Call clinic for new or changing moles.  Recommend daily use of broad spectrum spf 30+ sunscreen to sun-exposed areas.       Psoriasis vulgaris  Related Procedures QuantiFERON-TB Gold Plus  Plaque psoriasis  Related Medications risankizumab-rzaa (SKYRIZI PEN) 150 MG/ML pen Inject 1 mL (150 mg total) into the skin as directed. Every 12 weeks for maintenance.    Return in about 6 months (around 03/13/2024) for Psoriasis f/u.  I, Ardis Rowan, RMA, am acting as scribe for Armida Sans, MD .   Documentation: I have reviewed the  above documentation for accuracy and completeness, and I agree with  the above.  Armida Sans, MD

## 2023-09-19 ENCOUNTER — Encounter: Payer: Self-pay | Admitting: Dermatology

## 2023-10-03 DIAGNOSIS — M19041 Primary osteoarthritis, right hand: Secondary | ICD-10-CM | POA: Diagnosis not present

## 2023-10-03 DIAGNOSIS — I10 Essential (primary) hypertension: Secondary | ICD-10-CM | POA: Diagnosis not present

## 2023-10-03 DIAGNOSIS — L409 Psoriasis, unspecified: Secondary | ICD-10-CM | POA: Diagnosis not present

## 2023-10-03 DIAGNOSIS — Z79899 Other long term (current) drug therapy: Secondary | ICD-10-CM | POA: Diagnosis not present

## 2023-10-03 DIAGNOSIS — M47819 Spondylosis without myelopathy or radiculopathy, site unspecified: Secondary | ICD-10-CM | POA: Diagnosis not present

## 2023-10-03 DIAGNOSIS — E782 Mixed hyperlipidemia: Secondary | ICD-10-CM | POA: Diagnosis not present

## 2023-10-03 DIAGNOSIS — E669 Obesity, unspecified: Secondary | ICD-10-CM | POA: Diagnosis not present

## 2023-10-03 DIAGNOSIS — R7302 Impaired glucose tolerance (oral): Secondary | ICD-10-CM | POA: Diagnosis not present

## 2023-10-13 ENCOUNTER — Telehealth: Payer: Self-pay

## 2023-10-13 NOTE — Telephone Encounter (Signed)
Left message on voicemail to return my call. [facility] faxed [place] back PA for [facility] stating that patient will no longer have coverage with them after 10/25/23. If patient is having a change in insurance our office needs his new information so we can start PA for [facility] for 2025.

## 2023-10-17 NOTE — Telephone Encounter (Signed)
Patient's mother called back and left another voicemail that [employer] wouldn't be in-network with his providers, and they are going to go back on [employer]. Mother advised that when they receive his new insurance cards they will bring them to our office so we can start authorization for [employer].

## 2023-10-17 NOTE — Telephone Encounter (Signed)
Patient's mother called and he is changing to [employer] 2025. Mother will see if Cristy Folks can be shipped before end of the year and bring copy of new insurance card to the office. aw

## 2023-12-08 NOTE — Progress Notes (Signed)
This encounter was created in error - please disregard.

## 2024-01-24 DIAGNOSIS — M79644 Pain in right finger(s): Secondary | ICD-10-CM | POA: Diagnosis not present

## 2024-01-24 DIAGNOSIS — M79645 Pain in left finger(s): Secondary | ICD-10-CM | POA: Diagnosis not present

## 2024-01-24 DIAGNOSIS — M47819 Spondylosis without myelopathy or radiculopathy, site unspecified: Secondary | ICD-10-CM | POA: Diagnosis not present

## 2024-01-24 DIAGNOSIS — M7989 Other specified soft tissue disorders: Secondary | ICD-10-CM | POA: Diagnosis not present

## 2024-01-24 DIAGNOSIS — L409 Psoriasis, unspecified: Secondary | ICD-10-CM | POA: Diagnosis not present

## 2024-01-30 ENCOUNTER — Telehealth: Payer: Self-pay

## 2024-01-30 NOTE — Telephone Encounter (Signed)
 Left message with patient's mother to return my call.  Needing most updated copy of [employer] card for [employer]. aw

## 2024-03-03 DIAGNOSIS — Z791 Long term (current) use of non-steroidal anti-inflammatories (NSAID): Secondary | ICD-10-CM | POA: Diagnosis not present

## 2024-03-03 DIAGNOSIS — G3184 Mild cognitive impairment, so stated: Secondary | ICD-10-CM | POA: Diagnosis not present

## 2024-03-03 DIAGNOSIS — Z6836 Body mass index (BMI) 36.0-36.9, adult: Secondary | ICD-10-CM | POA: Diagnosis not present

## 2024-03-03 DIAGNOSIS — I10 Essential (primary) hypertension: Secondary | ICD-10-CM | POA: Diagnosis not present

## 2024-03-03 DIAGNOSIS — L4050 Arthropathic psoriasis, unspecified: Secondary | ICD-10-CM | POA: Diagnosis not present

## 2024-03-03 DIAGNOSIS — E785 Hyperlipidemia, unspecified: Secondary | ICD-10-CM | POA: Diagnosis not present

## 2024-03-03 DIAGNOSIS — L400 Psoriasis vulgaris: Secondary | ICD-10-CM | POA: Diagnosis not present

## 2024-03-03 DIAGNOSIS — Z008 Encounter for other general examination: Secondary | ICD-10-CM | POA: Diagnosis not present

## 2024-03-03 DIAGNOSIS — Z79620 Long term (current) use of immunosuppressive biologic: Secondary | ICD-10-CM | POA: Diagnosis not present

## 2024-03-03 DIAGNOSIS — Z8249 Family history of ischemic heart disease and other diseases of the circulatory system: Secondary | ICD-10-CM | POA: Diagnosis not present

## 2024-03-03 DIAGNOSIS — M62838 Other muscle spasm: Secondary | ICD-10-CM | POA: Diagnosis not present

## 2024-03-03 DIAGNOSIS — D84821 Immunodeficiency due to drugs: Secondary | ICD-10-CM | POA: Diagnosis not present

## 2024-03-08 DIAGNOSIS — E559 Vitamin D deficiency, unspecified: Secondary | ICD-10-CM | POA: Diagnosis not present

## 2024-03-08 DIAGNOSIS — Z7960 Long term (current) use of unspecified immunomodulators and immunosuppressants: Secondary | ICD-10-CM | POA: Diagnosis not present

## 2024-03-08 DIAGNOSIS — R768 Other specified abnormal immunological findings in serum: Secondary | ICD-10-CM | POA: Diagnosis not present

## 2024-03-08 DIAGNOSIS — L400 Psoriasis vulgaris: Secondary | ICD-10-CM | POA: Diagnosis not present

## 2024-03-08 DIAGNOSIS — M47819 Spondylosis without myelopathy or radiculopathy, site unspecified: Secondary | ICD-10-CM | POA: Diagnosis not present

## 2024-03-08 DIAGNOSIS — Z111 Encounter for screening for respiratory tuberculosis: Secondary | ICD-10-CM | POA: Diagnosis not present

## 2024-03-08 DIAGNOSIS — L4050 Arthropathic psoriasis, unspecified: Secondary | ICD-10-CM | POA: Diagnosis not present

## 2024-03-20 ENCOUNTER — Ambulatory Visit: Admitting: Dermatology

## 2024-03-20 ENCOUNTER — Encounter: Payer: Self-pay | Admitting: Dermatology

## 2024-03-20 ENCOUNTER — Telehealth: Payer: Self-pay

## 2024-03-20 DIAGNOSIS — L409 Psoriasis, unspecified: Secondary | ICD-10-CM | POA: Diagnosis not present

## 2024-03-20 DIAGNOSIS — L400 Psoriasis vulgaris: Secondary | ICD-10-CM

## 2024-03-20 DIAGNOSIS — Z79899 Other long term (current) drug therapy: Secondary | ICD-10-CM

## 2024-03-20 DIAGNOSIS — Z7189 Other specified counseling: Secondary | ICD-10-CM

## 2024-03-20 MED ORDER — SKYRIZI PEN 150 MG/ML ~~LOC~~ SOAJ: 150.0000 mg | SUBCUTANEOUS | 2 refills | Status: AC

## 2024-03-20 NOTE — Progress Notes (Signed)
   Follow-Up Visit   Subjective  Bryan Chandler. is a 53 y.o. male who presents for the following: Psoriasis 6 months f/u, past treatment Skyrizi  injections helped, patient stopped Skyrizi  3 months ago due to insurance stop covering Skyrizi , treating with a topical cream only   The following portions of the chart were reviewed this encounter and updated as appropriate: medications, allergies, medical history  Review of Systems:  No other skin or systemic complaints except as noted in HPI or Assessment and Plan.  Objective  Well appearing patient in no apparent distress; mood and affect are within normal limits.  Areas Examined: Trunk, arms, legs, scalp, ears   Relevant exam findings are noted in the Assessment and Plan.   Assessment & Plan   PLAQUE PSORIASIS   Related Procedures QuantiFERON-TB Gold Plus Related Medications risankizumab -rzaa (SKYRIZI  PEN) 150 MG/ML pen Inject 1 mL (150 mg total) into the skin as directed. Every 12 weeks for maintenance.  PSORIASIS Well-demarcated erythematous papules/plaques with silvery scale, guttate pink scaly papules on the left and right flank  50% BSA in the past before starting Systemic treatment  5% BSA today   Chronic and persistent condition with duration or expected duration over one year. Condition is bothersome/symptomatic for patient. Currently flared after off Skyrizi .  Patient report joint pain-hands  Re-assess after pt back on Skyrizi  or other biologic (Tremfya? if Skyrizi  not covered by insurance any more)  Treatment Plan: Labs reviewed 01/24/2024 CBC with diff, CMET - okay  Labs ordered QuantiFeron TB gold ordered-  Pt did not get TB test 6 mos ago when we ordered it which may be why insurance is denying coverage.  Continue Skyrizi  150 mg injection sample given  Lot [id number] Exp 03/25/2025  If insurance will not cover Skyrizi  we will try Tremyfa   Counseling and coordination of care for severe psoriasis on systemic  treatment  Psoriasis - severe on systemic treatment.  Psoriasis is a chronic non-curable, but treatable genetic/hereditary disease that may have other systemic features affecting other organ systems such as joints (Psoriatic Arthritis).  It is linked with heart disease, inflammatory bowel disease, non-alcoholic fatty liver disease, and depression. Significant skin psoriasis and/or psoriatic arthritis may have significant symptoms and affects activities of daily activity and often benefits from systemic treatments.  These systemic treatments have some potential side effects including immunosuppression and require pre-treatment laboratory screening and periodic laboratory monitoring and periodic in person evaluation and monitoring by the attending dermatologist physician (long term medication management).   Return in about 6 months (around 09/20/2024) for Psoriasis .  IClara Crisp, CMA, am acting as scribe for Celine Collard, MD .   Documentation: I have reviewed the above documentation for accuracy and completeness, and I agree with the above.  Celine Collard, MD

## 2024-03-20 NOTE — Telephone Encounter (Signed)
 Called LM with Bryan Chandler patient will need to come here to get TB skin test lab order,   Lab order left up front office for patient

## 2024-03-20 NOTE — Patient Instructions (Signed)

## 2024-03-21 ENCOUNTER — Ambulatory Visit: Payer: 59 | Admitting: Dermatology

## 2024-03-27 DIAGNOSIS — L4050 Arthropathic psoriasis, unspecified: Secondary | ICD-10-CM | POA: Diagnosis not present

## 2024-03-27 DIAGNOSIS — I10 Essential (primary) hypertension: Secondary | ICD-10-CM | POA: Diagnosis not present

## 2024-03-27 DIAGNOSIS — Z1331 Encounter for screening for depression: Secondary | ICD-10-CM | POA: Diagnosis not present

## 2024-03-27 DIAGNOSIS — E669 Obesity, unspecified: Secondary | ICD-10-CM | POA: Diagnosis not present

## 2024-03-27 DIAGNOSIS — R7302 Impaired glucose tolerance (oral): Secondary | ICD-10-CM | POA: Diagnosis not present

## 2024-03-27 DIAGNOSIS — E782 Mixed hyperlipidemia: Secondary | ICD-10-CM | POA: Diagnosis not present

## 2024-03-27 DIAGNOSIS — Z0000 Encounter for general adult medical examination without abnormal findings: Secondary | ICD-10-CM | POA: Diagnosis not present

## 2024-03-27 DIAGNOSIS — Z79899 Other long term (current) drug therapy: Secondary | ICD-10-CM | POA: Diagnosis not present

## 2024-03-27 DIAGNOSIS — Z125 Encounter for screening for malignant neoplasm of prostate: Secondary | ICD-10-CM | POA: Diagnosis not present

## 2024-03-27 DIAGNOSIS — M47819 Spondylosis without myelopathy or radiculopathy, site unspecified: Secondary | ICD-10-CM | POA: Diagnosis not present

## 2024-03-27 DIAGNOSIS — L400 Psoriasis vulgaris: Secondary | ICD-10-CM | POA: Diagnosis not present

## 2024-05-02 ENCOUNTER — Telehealth: Payer: Self-pay

## 2024-05-02 NOTE — Telephone Encounter (Signed)
 Contacted [place] to continue. aw

## 2024-05-02 NOTE — Telephone Encounter (Signed)
 Followed up with Bryan Chandler regarding Skyrizi  PA.  We sent in Skyrizi  on 5/27 but the rheumatologist had sent in [place] on 5/20. The insurance has denied coverage for the Tremfya. Should we continue with the PA for Skyrizi ?

## 2024-05-04 DIAGNOSIS — Z7960 Long term (current) use of unspecified immunomodulators and immunosuppressants: Secondary | ICD-10-CM | POA: Diagnosis not present

## 2024-05-04 DIAGNOSIS — L400 Psoriasis vulgaris: Secondary | ICD-10-CM | POA: Diagnosis not present

## 2024-05-04 DIAGNOSIS — M47819 Spondylosis without myelopathy or radiculopathy, site unspecified: Secondary | ICD-10-CM | POA: Diagnosis not present

## 2024-05-04 DIAGNOSIS — R768 Other specified abnormal immunological findings in serum: Secondary | ICD-10-CM | POA: Diagnosis not present

## 2024-05-04 DIAGNOSIS — L4050 Arthropathic psoriasis, unspecified: Secondary | ICD-10-CM | POA: Diagnosis not present

## 2024-05-08 NOTE — Telephone Encounter (Signed)
 Would you like patient to have starter dose of Skyrizi  again?

## 2024-05-09 MED ORDER — SKYRIZI PEN 150 MG/ML ~~LOC~~ SOAJ: 150.0000 mg | SUBCUTANEOUS | 1 refills | Status: AC

## 2024-05-09 NOTE — Telephone Encounter (Signed)
 I have received a phone call this afternoon from [facility] stating patient's rheumatologist Dr. Tobie has now started a process for Bimzelx. Would you like for me to advise Senderra to d/c Skyrizi  if Dr. Tobie is going to work for that RX?

## 2024-05-09 NOTE — Addendum Note (Signed)
 Addended by: TERESA PALMA R on: 05/09/2024 12:16 PM   Modules accepted: Orders

## 2024-05-09 NOTE — Telephone Encounter (Signed)
 I am working on renewing his Skyrizi  but it has been several months since he has taken an injection according to his last note and Bryan Chandler is wanting to know if you would like patient to have loading dose?

## 2024-05-09 NOTE — Telephone Encounter (Signed)
 I do apologize, I meant to send this to Dr. Hester.  Thank you!

## 2024-05-10 NOTE — Telephone Encounter (Signed)
 Advised Senderra to d/c Skyrizi  orders and contintue with Bimzelx with Dr. Tobie. aw

## 2024-08-07 DIAGNOSIS — L4050 Arthropathic psoriasis, unspecified: Secondary | ICD-10-CM | POA: Diagnosis not present

## 2024-08-07 DIAGNOSIS — M47819 Spondylosis without myelopathy or radiculopathy, site unspecified: Secondary | ICD-10-CM | POA: Diagnosis not present

## 2024-08-07 DIAGNOSIS — Z7960 Long term (current) use of unspecified immunomodulators and immunosuppressants: Secondary | ICD-10-CM | POA: Diagnosis not present

## 2024-08-07 DIAGNOSIS — L400 Psoriasis vulgaris: Secondary | ICD-10-CM | POA: Diagnosis not present

## 2024-09-25 ENCOUNTER — Encounter: Payer: Self-pay | Admitting: Dermatology

## 2024-09-25 ENCOUNTER — Ambulatory Visit: Admitting: Dermatology

## 2024-09-25 DIAGNOSIS — Q825 Congenital non-neoplastic nevus: Secondary | ICD-10-CM

## 2024-09-25 DIAGNOSIS — L4050 Arthropathic psoriasis, unspecified: Secondary | ICD-10-CM | POA: Diagnosis not present

## 2024-09-25 DIAGNOSIS — L409 Psoriasis, unspecified: Secondary | ICD-10-CM

## 2024-09-25 DIAGNOSIS — Z1283 Encounter for screening for malignant neoplasm of skin: Secondary | ICD-10-CM

## 2024-09-25 DIAGNOSIS — W908XXA Exposure to other nonionizing radiation, initial encounter: Secondary | ICD-10-CM | POA: Diagnosis not present

## 2024-09-25 DIAGNOSIS — L578 Other skin changes due to chronic exposure to nonionizing radiation: Secondary | ICD-10-CM

## 2024-09-25 DIAGNOSIS — D1801 Hemangioma of skin and subcutaneous tissue: Secondary | ICD-10-CM

## 2024-09-25 DIAGNOSIS — Z7189 Other specified counseling: Secondary | ICD-10-CM

## 2024-09-25 DIAGNOSIS — L819 Disorder of pigmentation, unspecified: Secondary | ICD-10-CM

## 2024-09-25 DIAGNOSIS — D229 Melanocytic nevi, unspecified: Secondary | ICD-10-CM

## 2024-09-25 DIAGNOSIS — Z79899 Other long term (current) drug therapy: Secondary | ICD-10-CM | POA: Diagnosis not present

## 2024-09-25 DIAGNOSIS — L814 Other melanin hyperpigmentation: Secondary | ICD-10-CM

## 2024-09-25 DIAGNOSIS — L821 Other seborrheic keratosis: Secondary | ICD-10-CM

## 2024-09-25 NOTE — Patient Instructions (Signed)

## 2024-09-25 NOTE — Progress Notes (Signed)
 Follow-Up Visit   Subjective  Bryan Chandler. is a 53 y.o. male who presents for the following: Skin Cancer Screening and Full Body Skin Exam, f/u Psoriasis, Dr. Tobie started patient on Bimzelx 03/2024, patient does have some joint pain in hands  The patient presents for Total-Body Skin Exam (TBSE) for skin cancer screening and mole check. The patient has spots, moles and lesions to be evaluated, some may be new or changing and the patient may have concern these could be cancer.  The following portions of the chart were reviewed this encounter and updated as appropriate: medications, allergies, medical history  Review of Systems:  No other skin or systemic complaints except as noted in HPI or Assessment and Plan.  Objective  Well appearing patient in no apparent distress; mood and affect are within normal limits.  A full examination was performed including scalp, head, eyes, ears, nose, lips, neck, chest, axillae, abdomen, back, buttocks, bilateral upper extremities, bilateral lower extremities, hands,fingers, and fingernails. All findings within normal limits unless otherwise noted below.   Relevant physical exam findings are noted in the Assessment and Plan.  Psoriasis     Congenital nevus    Assessment & Plan   SKIN CANCER SCREENING PERFORMED TODAY.  ACTINIC DAMAGE - Chronic condition, secondary to cumulative UV/sun exposure - diffuse scaly erythematous macules with underlying dyspigmentation - Recommend daily broad spectrum sunscreen SPF 30+ to sun-exposed areas, reapply every 2 hours as needed.  - Staying in the shade or wearing long sleeves, sun glasses (UVA+UVB protection) and wide brim hats (4-inch brim around the entire circumference of the hat) are also recommended for sun protection.  - Call for new or changing lesions.  LENTIGINES, SEBORRHEIC KERATOSES, HEMANGIOMAS - Benign normal skin lesions - Benign-appearing - Call for any changes  MELANOCYTIC  NEVI - Tan-brown and/or pink-flesh-colored symmetric macules and papules - Benign appearing on exam today - Observation - Call clinic for new or changing moles - Recommend daily use of broad spectrum spf 30+ sunscreen to sun-exposed areas.   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  Chronic and persistent condition with duration or expected duration over one year. Condition is improving with treatment but not currently at goal.  Joints improved on Bimzelx  Psoriasis - severe on systemic treatment.  Psoriasis is a chronic non-curable, but treatable genetic/hereditary disease that may have other systemic features affecting other organ systems such as joints (Psoriatic Arthritis).  It is linked with heart disease, inflammatory bowel disease, non-alcoholic fatty liver disease, and depression. Significant skin psoriasis and/or psoriatic arthritis may have significant symptoms and affects activities of daily activity and often benefits from systemic treatments.  These systemic treatments have some potential side effects including immunosuppression and require pre-treatment laboratory screening and periodic laboratory monitoring and periodic in person evaluation and monitoring by the attending dermatologist physician (long term medication management).   Treatment Plan: Cont Bimzelx as prescribed by Dr. Tobie  Reviewed risks of biologics including immunosuppression, infections (i.e. TB reactivation), injection site reaction, and failure to improve condition. Goal is control of skin condition, not cure.  Some older biologics such as Humira and Enbrel may slightly increase risk of malignancy and may worsen congestive heart failure.  Taltz, Cosentyx , and Bimzelx may  cause inflammatory bowel disease to flare or  may increase incidence of yeast infections. Skyrizi , Tremfya, and Stelara may also slightly increase risk of infection. The use of biologics requires long term medication management, including periodic office visits, annual TB screening test and monitoring of blood work.  Monitoring for Tuberculosis Infection:  Patient is currently receiving biologic therapy for psoriasis, which carries a potential risk for reactivation of tuberculosis infection. As part of ongoing safety monitoring, a QuantiFERON-TB Gold (QFT-G) test is checked prior to initiation of biologic therapy and yearly per guidelines. - Most recent QFT-G result:     Latest Ref Rng & Units 10/22/2022   11:05 AM  Quantiferon TB Gold  Quantiferon TB Gold Plus Negative Negative     - Will recheck yearly  - No signs or symptoms of active TB noted. - Will continue to monitor per standard biologic safety protocol. - If QFT-G becomes positive or patient develops symptoms suggestive of TB, appropriate evaluation will be initiated  Long term medication management.  Patient is using long term (months to years) prescription medication  to control their dermatologic condition.  These medications require periodic monitoring to evaluate for efficacy and side effects and may require periodic laboratory monitoring.     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  Stable. Observation.  Call clinic for new or changing moles.   Recommend daily use of broad spectrum spf 30+ sunscreen to sun-exposed areas.    ABCDEs of mole observation discussed and patient handout given.  RTC if any changes noted.    Return in about 6 months (around 03/26/2025) for Psoriasis f/u.  I, Grayce Saunas, RMA, am acting as scribe for Alm Rhyme, MD .   Documentation: I have reviewed the above documentation for accuracy  and completeness, and I agree with the above.  Alm Rhyme, MD

## 2024-09-26 DIAGNOSIS — L400 Psoriasis vulgaris: Secondary | ICD-10-CM | POA: Diagnosis not present

## 2024-09-26 DIAGNOSIS — M47819 Spondylosis without myelopathy or radiculopathy, site unspecified: Secondary | ICD-10-CM | POA: Diagnosis not present

## 2024-09-26 DIAGNOSIS — I10 Essential (primary) hypertension: Secondary | ICD-10-CM | POA: Diagnosis not present

## 2024-09-26 DIAGNOSIS — E669 Obesity, unspecified: Secondary | ICD-10-CM | POA: Diagnosis not present

## 2024-09-26 DIAGNOSIS — E782 Mixed hyperlipidemia: Secondary | ICD-10-CM | POA: Diagnosis not present

## 2024-09-26 DIAGNOSIS — L4050 Arthropathic psoriasis, unspecified: Secondary | ICD-10-CM | POA: Diagnosis not present

## 2024-09-26 DIAGNOSIS — R7302 Impaired glucose tolerance (oral): Secondary | ICD-10-CM | POA: Diagnosis not present

## 2024-09-26 DIAGNOSIS — Z1331 Encounter for screening for depression: Secondary | ICD-10-CM | POA: Diagnosis not present

## 2024-09-26 DIAGNOSIS — Z79899 Other long term (current) drug therapy: Secondary | ICD-10-CM | POA: Diagnosis not present

## 2025-04-02 ENCOUNTER — Ambulatory Visit: Admitting: Dermatology
